# Patient Record
Sex: Female | Born: 1980 | Race: Black or African American | Hispanic: No | Smoking: Never smoker
Health system: Southern US, Community
[De-identification: ages and names within clinical notes are randomized; demographics above are authoritative.]

## PROBLEM LIST (undated history)

## (undated) ENCOUNTER — Emergency Department: Admission: EM | Disposition: A | Discharge status: Home / Self Care | Payer: Medicaid Other

## (undated) DIAGNOSIS — F32A Depression, unspecified: Secondary | ICD-10-CM

## (undated) DIAGNOSIS — I517 Cardiomegaly: Secondary | ICD-10-CM

## (undated) DIAGNOSIS — F329 Major depressive disorder, single episode, unspecified: Secondary | ICD-10-CM

## (undated) DIAGNOSIS — R7611 Nonspecific reaction to tuberculin skin test without active tuberculosis: Secondary | ICD-10-CM

## (undated) DIAGNOSIS — F909 Attention-deficit hyperactivity disorder, unspecified type: Secondary | ICD-10-CM

## (undated) DIAGNOSIS — A749 Chlamydial infection, unspecified: Secondary | ICD-10-CM

## (undated) DIAGNOSIS — F419 Anxiety disorder, unspecified: Secondary | ICD-10-CM

## (undated) DIAGNOSIS — K219 Gastro-esophageal reflux disease without esophagitis: Secondary | ICD-10-CM

## (undated) DIAGNOSIS — T1490XA Injury, unspecified, initial encounter: Secondary | ICD-10-CM

## (undated) DIAGNOSIS — B019 Varicella without complication: Secondary | ICD-10-CM

## (undated) DIAGNOSIS — O139 Gestational [pregnancy-induced] hypertension without significant proteinuria, unspecified trimester: Secondary | ICD-10-CM

## (undated) DIAGNOSIS — D509 Iron deficiency anemia, unspecified: Secondary | ICD-10-CM

## (undated) HISTORY — DX: Major depressive disorder, single episode, unspecified: F32.9

## (undated) HISTORY — DX: Gestational (pregnancy-induced) hypertension without significant proteinuria, unspecified trimester: O13.9

## (undated) HISTORY — DX: Iron deficiency anemia, unspecified: D50.9

## (undated) HISTORY — DX: Chlamydial infection, unspecified: A74.9

## (undated) HISTORY — DX: Injury, unspecified, initial encounter: T14.90XA

## (undated) HISTORY — DX: Morbid (severe) obesity due to excess calories: E66.01

## (undated) HISTORY — DX: Gastro-esophageal reflux disease without esophagitis: K21.9

## (undated) HISTORY — DX: Attention-deficit hyperactivity disorder, unspecified type: F90.9

## (undated) HISTORY — DX: Varicella without complication: B01.9

## (undated) HISTORY — DX: Anxiety disorder, unspecified: F41.9

## (undated) HISTORY — DX: Depression, unspecified: F32.A

## (undated) HISTORY — DX: Nonspecific reaction to tuberculin skin test without active tuberculosis: R76.11

---

## 2013-04-17 DIAGNOSIS — K219 Gastro-esophageal reflux disease without esophagitis: Secondary | ICD-10-CM | POA: Insufficient documentation

## 2013-06-25 DIAGNOSIS — R7611 Nonspecific reaction to tuberculin skin test without active tuberculosis: Secondary | ICD-10-CM | POA: Insufficient documentation

## 2013-08-02 DIAGNOSIS — D509 Iron deficiency anemia, unspecified: Secondary | ICD-10-CM | POA: Insufficient documentation

## 2016-02-21 DIAGNOSIS — O99213 Obesity complicating pregnancy, third trimester: Secondary | ICD-10-CM | POA: Insufficient documentation

## 2016-02-21 DIAGNOSIS — O30043 Twin pregnancy, dichorionic/diamniotic, third trimester: Secondary | ICD-10-CM | POA: Insufficient documentation

## 2016-02-21 DIAGNOSIS — O34219 Maternal care for unspecified type scar from previous cesarean delivery: Secondary | ICD-10-CM | POA: Insufficient documentation

## 2017-08-11 DIAGNOSIS — O163 Unspecified maternal hypertension, third trimester: Secondary | ICD-10-CM | POA: Insufficient documentation

## 2018-02-28 ENCOUNTER — Emergency Department
Admission: EM | Admit: 2018-02-28 | Discharge: 2018-02-28 | Disposition: A | Discharge status: Home / Self Care | Payer: Managed Care, Other (non HMO) | Attending: Emergency Medicine | Admitting: Emergency Medicine

## 2018-02-28 ENCOUNTER — Other Ambulatory Visit: Payer: Self-pay

## 2018-02-28 ENCOUNTER — Encounter: Payer: Self-pay | Admitting: Emergency Medicine

## 2018-02-28 DIAGNOSIS — R1030 Lower abdominal pain, unspecified: Secondary | ICD-10-CM | POA: Insufficient documentation

## 2018-02-28 DIAGNOSIS — R197 Diarrhea, unspecified: Secondary | ICD-10-CM | POA: Insufficient documentation

## 2018-02-28 LAB — COMPREHENSIVE METABOLIC PANEL
ALK PHOS: 59 U/L (ref 38–126)
ALT: 20 U/L (ref 14–54)
AST: 26 U/L (ref 15–41)
Albumin: 3.7 g/dL (ref 3.5–5.0)
Anion gap: 9 (ref 5–15)
BUN: 9 mg/dL (ref 6–20)
CALCIUM: 9.1 mg/dL (ref 8.9–10.3)
CO2: 25 mmol/L (ref 22–32)
CREATININE: 0.73 mg/dL (ref 0.44–1.00)
Chloride: 105 mmol/L (ref 101–111)
Glucose, Bld: 127 mg/dL — ABNORMAL HIGH (ref 65–99)
Potassium: 3.7 mmol/L (ref 3.5–5.1)
Sodium: 139 mmol/L (ref 135–145)
Total Bilirubin: 0.4 mg/dL (ref 0.3–1.2)
Total Protein: 7.5 g/dL (ref 6.5–8.1)

## 2018-02-28 LAB — CBC
HCT: 35.6 % (ref 35.0–47.0)
Hemoglobin: 11.4 g/dL — ABNORMAL LOW (ref 12.0–16.0)
MCH: 23.9 pg — AB (ref 26.0–34.0)
MCHC: 32 g/dL (ref 32.0–36.0)
MCV: 74.8 fL — AB (ref 80.0–100.0)
PLATELETS: 331 10*3/uL (ref 150–440)
RBC: 4.76 MIL/uL (ref 3.80–5.20)
RDW: 17.9 % — AB (ref 11.5–14.5)
WBC: 11.3 10*3/uL — ABNORMAL HIGH (ref 3.6–11.0)

## 2018-02-28 LAB — URINALYSIS, COMPLETE (UACMP) WITH MICROSCOPIC
Bacteria, UA: NONE SEEN
Bilirubin Urine: NEGATIVE
GLUCOSE, UA: NEGATIVE mg/dL
Hgb urine dipstick: NEGATIVE
Ketones, ur: NEGATIVE mg/dL
Leukocytes, UA: NEGATIVE
Nitrite: NEGATIVE
PH: 7 (ref 5.0–8.0)
Protein, ur: NEGATIVE mg/dL
SPECIFIC GRAVITY, URINE: 1.015 (ref 1.005–1.030)

## 2018-02-28 LAB — POCT PREGNANCY, URINE: Preg Test, Ur: NEGATIVE

## 2018-02-28 LAB — LIPASE, BLOOD: Lipase: 28 U/L (ref 11–51)

## 2018-02-28 NOTE — ED Provider Notes (Signed)
[facility] Emergency Department Provider Note   ____________________________________________   First MD Initiated Contact with Patient 02/28/18 [phone]     (approximate)  I have reviewed the triage vital signs and the nursing notes.   HISTORY  Chief Complaint Diarrhea    HPI Yesenia Thomas is a 37 y.o. female who comes into the hospital today with some loose stools for the past couple of weeks.  She reports that her friend was diagnosed with C. difficile so she wanted to check to ensure that she did not have C. difficile.  The patient has not had any fevers and she is had some mild abdominal pain.  She reports it is in her lower abdomen and she rates her pain a 6 out of 10 in intensity.  The patient has not had any vomiting.  She states that she is also lactose intolerant and has been drinking different types of milk.  She reports that when she drinks different types of milk it does also cause some diarrhea for her.  The patient was concerned so she decided to come and get checked out.   History reviewed. No pertinent past medical history.  There are no active problems to display for this patient.   Past Surgical History:  Procedure Laterality Date  . CESAREAN SECTION      Prior to Admission medications   Not on File    Allergies Lactose intolerance (gi) and Percocet [oxycodone-acetaminophen]  No family history on file.  Social History Social History   Tobacco Use  . Smoking status: Never Smoker  . Smokeless tobacco: Never Used  Substance Use Topics  . Alcohol use: Not Currently  . Drug use: Never    Review of Systems  Constitutional: No fever/chills Eyes: No visual changes. ENT: No sore throat. Cardiovascular: Denies chest pain. Respiratory: Denies shortness of breath. Gastrointestinal:  abdominal pain and diarrhea with no nausea, no vomiting.  No constipation. Genitourinary: Negative for dysuria. Musculoskeletal: Negative  for back pain. Skin: Negative for rash. Neurological: Negative for headaches, focal weakness or numbness.   ____________________________________________   PHYSICAL EXAM:  VITAL SIGNS: ED Triage Vitals  Enc Vitals Group     BP 02/28/18 0527 123/72     Pulse Rate 02/28/18 0527 83     Resp 02/28/18 0527 17     Temp --      Temp src --      SpO2 02/28/18 0527 100 %     Weight 02/28/18 0237 (!) 325 lb (147.4 kg)     Height 02/28/18 0237 5\' 6"  (1.676 m)     Head Circumference --      Peak Flow --      Pain Score 02/28/18 0237 6     Pain Loc --      Pain Edu? --      Excl. in GC? --     Constitutional: Alert and oriented. Well appearing and in mild distress. Eyes: Conjunctivae are normal. PERRL. EOMI. Head: Atraumatic. Nose: No congestion/rhinnorhea. Mouth/Throat: Mucous membranes are moist.  Oropharynx non-erythematous. Cardiovascular: Normal rate, regular rhythm. Grossly normal heart sounds.  Good peripheral circulation. Respiratory: Normal respiratory effort.  No retractions. Lungs CTAB. Gastrointestinal: Soft and nontender. No distention.  Positive bowel sounds Musculoskeletal: No lower extremity tenderness nor edema.  Neurologic:  Normal speech and language.  Skin:  Skin is warm, dry and intact.  Psychiatric: Mood and affect are normal.   ____________________________________________   LABS (all labs ordered are listed,  but only abnormal results are displayed)  Labs Reviewed  COMPREHENSIVE METABOLIC PANEL - Abnormal; Notable for the following components:      Result Value   Glucose, Bld 127 (*)    All other components within normal limits  CBC - Abnormal; Notable for the following components:   WBC 11.3 (*)    Hemoglobin 11.4 (*)    MCV 74.8 (*)    MCH 23.9 (*)    RDW 17.9 (*)    All other components within normal limits  URINALYSIS, COMPLETE (UACMP) WITH MICROSCOPIC - Abnormal; Notable for the following components:   Color, Urine YELLOW (*)    APPearance  CLEAR (*)    All other components within normal limits  GASTROINTESTINAL PANEL BY PCR, STOOL (REPLACES STOOL CULTURE)  C DIFFICILE QUICK SCREEN W PCR REFLEX  LIPASE, BLOOD  POC URINE PREG, ED  POCT PREGNANCY, URINE   ____________________________________________  EKG  none ____________________________________________  RADIOLOGY  ED MD interpretation:  none  Official radiology report(s): No results found.  ____________________________________________   PROCEDURES  Procedure(s) performed: None  Procedures  Critical Care performed: No  ____________________________________________   INITIAL IMPRESSION / ASSESSMENT AND PLAN / ED COURSE  As part of my medical decision making, I reviewed the following data within the electronic MEDICAL RECORD NUMBER Notes from prior ED visits and [place] Controlled Substance Database   This is a 37 year old female who comes into the hospital today with some diarrhea and abdominal pain.  She is concerned about C. Difficile.  We did check some blood work on the patient to include a CBC, CMP, lipase.  The patient blood work is unremarkable.  The patient also had a urinalysis which was negative.  I did initially order stool PCR as well as a C. difficile but the patient could not have a bowel movement in the 5 hours that she had been in the emergency department.  I did asked the patient if she would rather follow-up with her primary care physician.  We will give the patient some urine specimen cups and have her follow-up with the acute care clinic should she be able to produce a sample.  The patient did not have any abdominal pain on exam and she was comfortable while in the emergency department.  Her vital signs are also unremarkable.  The patient will be discharged home to follow-up with the acute care clinic.     ____________________________________________   FINAL CLINICAL IMPRESSION(S) / ED DIAGNOSES  Final diagnoses:  Diarrhea, unspecified type      ED Discharge Orders    None       Note:  This document was prepared using Dragon voice recognition software and may include unintentional dictation errors.    Rebecka ApleyWebster, Dory Verdun P, MD 02/28/18 [phone]

## 2018-02-28 NOTE — Discharge Instructions (Addendum)
Please follow-up with your primary care physician for further evaluation of your diarrhea.  Please return with any fevers or any other concerns

## 2018-02-28 NOTE — ED Notes (Signed)
Pt resting in bed with NAD noted at this time. Pt noted to be resting with eyes closed, noted to be snoring upon this RN entering room. Pt noted to be holding her son who is also a patient. Will continue to monitor for further patient needs and will introduce self upon patient awakening.

## 2018-02-28 NOTE — ED Notes (Signed)
NAD noted at time of D/C. Pt ambulatory to the lobby with her son who was also a patient. Pt given sterile cups to collect stool sample at home and instructed to [facility] if she was able to collect sample. Pt states understanding.

## 2018-02-28 NOTE — ED Triage Notes (Signed)
Patient ambulatory to triage with steady gait, without difficulty or distress noted; pt reports diarrhea and abd cramping several wks; here with son also being seen for same; st she is concerned because her friend has c-diff

## 2018-07-17 ENCOUNTER — Ambulatory Visit: Payer: Self-pay | Admitting: Family Medicine

## 2018-07-18 ENCOUNTER — Ambulatory Visit: Payer: Self-pay | Admitting: Family Medicine

## 2018-11-27 ENCOUNTER — Encounter: Payer: Self-pay | Admitting: Emergency Medicine

## 2018-11-27 ENCOUNTER — Encounter: Payer: Self-pay | Admitting: Family Medicine

## 2018-11-27 ENCOUNTER — Ambulatory Visit: Payer: Managed Care, Other (non HMO) | Admitting: Family Medicine

## 2018-11-27 ENCOUNTER — Other Ambulatory Visit: Payer: Self-pay

## 2018-11-27 VITALS — BP 128/72 | HR 88 | Temp 99.4°F | Resp 16 | Ht 66.0 in | Wt >= 6400 oz

## 2018-11-27 DIAGNOSIS — R0602 Shortness of breath: Secondary | ICD-10-CM

## 2018-11-27 DIAGNOSIS — R05 Cough: Secondary | ICD-10-CM

## 2018-11-27 DIAGNOSIS — R0789 Other chest pain: Secondary | ICD-10-CM | POA: Diagnosis not present

## 2018-11-27 DIAGNOSIS — R058 Other specified cough: Secondary | ICD-10-CM

## 2018-11-27 LAB — POCT INFLUENZA A/B
INFLUENZA A, POC: NEGATIVE
INFLUENZA B, POC: NEGATIVE

## 2018-11-27 LAB — POCT RAPID STREP A (OFFICE): Rapid Strep A Screen: NEGATIVE

## 2018-11-27 MED ORDER — BENZONATATE 100 MG PO CAPS: 100.0000 mg | ORAL_CAPSULE | Freq: Three times a day (TID) | ORAL | 0 refills | Status: DC | PRN

## 2018-11-27 MED ORDER — ALBUTEROL SULFATE HFA 108 (90 BASE) MCG/ACT IN AERS: 2.0000 | INHALATION_SPRAY | Freq: Four times a day (QID) | RESPIRATORY_TRACT | 2 refills | Status: DC | PRN

## 2018-11-27 NOTE — Patient Instructions (Addendum)
Tomorrow: You need to take your order for COVID19 testing to the "Drive through" testing site at [address] [facility] [place].  Coronavirus (COVID-19) Are you at risk?  Are you at risk for the Coronavirus (COVID-19)?  To be considered HIGH RISK for Coronavirus (COVID-19), you have to meet the following criteria:  . Traveled to [place], [place], [place], [place] or [place]; or in the [place] to [place], [place], [place], or [place]; and have fever, cough, and shortness of breath within the last 2 weeks of travel OR . Been in close contact with a person diagnosed with COVID-19 within the last 2 weeks and have fever, cough, and shortness of breath . IF YOU DO NOT MEET THESE CRITERIA, YOU ARE CONSIDERED LOW RISK FOR COVID-19.  What to do if you are HIGH RISK for COVID-19?  Marland Kitchen If you are having a medical emergency, call 911. . Seek medical care right away. Before you go to a doctor's office, urgent care or emergency department, call ahead and tell them about your recent travel, contact with someone diagnosed with COVID-19, and your symptoms. You should receive instructions from your physician's office regarding next steps of care.  . When you arrive at healthcare provider, tell the healthcare staff immediately you have returned from visiting [place], [place], [place], [place] or [place]; or traveled in the [place] to [place], [place], [place], or [place]; in the last two weeks or you have been in close contact with a person diagnosed with COVID-19 in the last 2 weeks.   . Tell the health care staff about your symptoms: fever, cough and shortness of breath. . After you have been seen by a medical provider, you will be either: o Tested for (COVID-19) and discharged home on quarantine except to seek medical care if symptoms worsen, and asked to  - Stay home and avoid contact with others until you get your results (4-5 days)  - Avoid travel on public transportation if  possible (such as bus, train, or airplane) or o Sent to the Emergency Department by EMS for evaluation, COVID-19 testing, and possible admission depending on your condition and test results.  What to do if you are LOW RISK for COVID-19?  Reduce your risk of any infection by using the same precautions used for avoiding the common cold or flu:  Marland Kitchen Wash your hands often with soap and warm water for at least 20 seconds.  If soap and water are not readily available, use an alcohol-based hand sanitizer with at least 60% alcohol.  . If coughing or sneezing, cover your mouth and nose by coughing or sneezing into the elbow areas of your shirt or coat, into a tissue or into your sleeve (not your hands). . Avoid shaking hands with others and consider head nods or verbal greetings only. . Avoid touching your eyes, nose, or mouth with unwashed hands.  . Avoid close contact with people who are sick. . Avoid places or events with large numbers of people in one location, like concerts or sporting events. . Carefully consider travel plans you have or are making. . If you are planning any travel outside or inside the [place], visit the CDC's Travelers' Health webpage for the latest health notices. . If you have some symptoms but not all symptoms, continue to monitor at home and seek medical attention if your symptoms worsen. . If you are having a medical emergency, call 911.   ADDITIONAL HEALTHCARE OPTIONS FOR  PATIENTS  [facility] / e-Visit: [email or web address]         [facility]: [phone]  [facility]: [phone]                   [facility]: [phone]

## 2018-11-27 NOTE — Progress Notes (Signed)
Name: Yesenia Thomas   MRN: [id number]    DOB: 02-27-1981   Date:11/27/2018       Progress Note  Subjective  Chief Complaint  Chief Complaint  Patient presents with  . Establish Care  . URI    cough, fever, sore throat, fever  . Shortness of Breath    HPI  Pt presents to establish care and with concern for respiratory illness x3 days.  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  They have had multiple COVID-19 cases, and she is unsure if she was in contact with any infected persons.  She described subjective fevers at home (temp in office today is 99.1F), shortness of breath, chest tightness, non-productive cough. No nasal congestion, asthma, smoking, or COPD history. No recent travel.  She has her 55 month old son with her today.  There are no active problems to display for this patient.   Past Surgical History:  Procedure Laterality Date  . CESAREAN SECTION      Family History  Problem Relation Age of Onset  . Hypertension Mother   . Thrombosis Maternal Grandmother   . Clotting disorder Maternal Grandfather   . Deep vein thrombosis Maternal Grandfather     Social History   Socioeconomic History  . Marital status: Unknown    Spouse name: Not on file  . Number of children: Not on file  . Years of education: Not on file  . Highest education level: Not on file  Occupational History  . Not on file  Social Needs  . Financial resource strain: Not on file  . Food insecurity:    Worry: Not on file    Inability: Not on file  . Transportation needs:    Medical: Not on file    Non-medical: Not on file  Tobacco Use  . Smoking status: Never Smoker  . Smokeless tobacco: Never Used  Substance and Sexual Activity  . Alcohol use: Not Currently  . Drug use: Never  . Sexual activity: Yes  Lifestyle  . Physical activity:    Days per week: Not on file    Minutes per session: Not on file  . Stress: Not on file  Relationships   . Social connections:    Talks on phone: Not on file    Gets together: Not on file    Attends religious service: Not on file    Active member of club or organization: Not on file    Attends meetings of clubs or organizations: Not on file    Relationship status: Not on file  . Intimate partner violence:    Fear of current or ex partner: Not on file    Emotionally abused: Not on file    Physically abused: Not on file    Forced sexual activity: Not on file  Other Topics Concern  . Not on file  Social History Narrative  . Not on file     Current Outpatient Medications:  .  Acetaminophen 500 MG coapsule, Take by mouth., Disp: , Rfl:  .  albuterol (PROVENTIL HFA;VENTOLIN HFA) 108 (90 Base) MCG/ACT inhaler, Inhale 2 puffs into the lungs every 6 (six) hours as needed for wheezing or shortness of breath., Disp: 1 Inhaler, Rfl: 2 .  benzonatate (TESSALON PERLES) 100 MG capsule, Take 1 capsule (100 mg total) by mouth 3 (three) times daily as needed for cough., Disp: 30 capsule, Rfl: 0  Allergies  Allergen Reactions  .  Lactose Intolerance (Gi)   . Percocet [Oxycodone-Acetaminophen]     I personally reviewed active problem list, medication list, allergies, lab results with the patient/caregiver today.  ROS Ten systems reviewed and is negative except as mentioned in HPI  Objective  Vitals:   11/27/18 1417  BP: 128/72  Pulse: 88  Resp: 16  Temp: 99.4 F (37.4 C)  TempSrc: Oral  SpO2: 95%  Weight: (!) 405 lb (183.7 kg)  Height: 5\' 6"  (1.676 m)    Body mass index is 65.37 kg/m.  Physical Exam Constitutional: Patient appears well-developed and well-nourished. Morbidly Obese. No distress.  HEENT: head atraumatic, normocephalic, pupils equal and reactive to light, Bilateral TM's without erythema or effusion,  bilateral maxillary and frontal sinuses are non-tender, neck supple without lymphadenopathy, throat within normal limits - no erythema or exudate, no tonsillar swelling  Cardiovascular: Normal rate, regular rhythm and normal heart sounds.  No murmur heard. No BLE edema. Pulmonary/Chest: Effort normal and breath sounds clear bilaterally. No respiratory distress. Abdominal: Soft, bowel sounds normal, there is no tenderness, no HSM Psychiatric: Patient has a normal mood and affect. behavior is normal. Judgment and thought content normal.  No results found for this or any previous visit (from the past 72 hour(s)).   PHQ2/9: Deferred due to concern for COVID-19  Assessment & Plan  1. Shortness of breath - POCT Influenza A/B - Negative - POCT rapid strep A - Negative - Respiratory virus panel - pending - Novel Coronavirus, NAA ([facility]) - albuterol (PROVENTIL HFA;VENTOLIN HFA) 108 (90 Base) MCG/ACT inhaler; Inhale 2 puffs into the lungs every 6 (six) hours as needed for wheezing or shortness of breath.  Dispense: 1 Inhaler; Refill: 2  2. Chest tightness - POCT Influenza A/B - Negative - POCT rapid strep A - Negative - Respiratory virus panel - pending - Novel Coronavirus, NAA ([facility])  3. Non-productive cough - POCT Influenza A/B - Negative - POCT rapid strep A - Negative - Respiratory virus panel - pending - Novel Coronavirus, NAA ([facility])  Patient is in no acute distress, she does answer questions consistent with a mild lower respiratory illness and has had potential contact with a case at her job at [employer] as a CNA in the float pool at their hospital.  Therefore, I have followed the provided protocols, a log of who came into contact with the patient during her visit, a PUI and Case Report Form, and the Guidance for PUI self-quarantine forms are completed and will be faxed to the Health Department.    I did place a call to Angie with IP who took down patient's information. Placed call to [facility] Department - Glenna Fellows, then faxed the appropriate paperwork to the HD.

## 2018-11-28 ENCOUNTER — Other Ambulatory Visit: Payer: Self-pay

## 2018-11-28 ENCOUNTER — Telehealth: Payer: Self-pay | Admitting: Family Medicine

## 2018-11-28 DIAGNOSIS — R6889 Other general symptoms and signs: Secondary | ICD-10-CM

## 2018-11-28 LAB — RESPIRATORY VIRUS PANEL

## 2018-11-28 NOTE — Telephone Encounter (Signed)
Miel spoke with patient an informed her where to go for the drive through COVID Testing Center at [address] [place], [place], [place] [address].  Patient was given this information on her visit on 11/27/2018.  Miel also informed patient to arrive by 5pm today because the hours are from 8am - 5pm.  Patient verbalized understanding.

## 2018-11-29 ENCOUNTER — Telehealth: Payer: Self-pay | Admitting: Family Medicine

## 2018-11-29 NOTE — Telephone Encounter (Signed)
Please speak with Miel regarding this - if there is any protocol in place, then proceed with that.  If not, please call health department to notify that patient has not gone for testing and we have not been able to contact her.  Her symptoms were mild, so if she refuses testing or is unable to go, I recommend self-quarantine per guidelines.

## 2018-11-29 NOTE — Telephone Encounter (Signed)
Please call patient - as of yesterday afternoon she had not gone for testing for COVID-19 at [facility], and she absolutely MUST go to do this.  If she has not gone by today, please call health department and obtain further instruction on what to do if she refuses to go to be tested.  Please again advise that she continue to remain completely quarantined at home, following the guidelines that I gave her at her visit until I receive her results. Thanks!

## 2018-11-29 NOTE — Telephone Encounter (Signed)
Called patient and voice mail cannot take any messages

## 2018-11-29 NOTE — Telephone Encounter (Signed)
Patient called back and stated she went to Testing Center yesterday

## 2018-12-01 ENCOUNTER — Telehealth: Payer: Self-pay | Admitting: Family Medicine

## 2018-12-01 DIAGNOSIS — R110 Nausea: Secondary | ICD-10-CM

## 2018-12-01 DIAGNOSIS — R0602 Shortness of breath: Secondary | ICD-10-CM

## 2018-12-01 NOTE — Telephone Encounter (Signed)
rec'd voice message on the COVID 19 message line from [facility], Production designer, theatre/television/film at [employer].  Requesting information on when pt. Can return to work, after being seen in the office on 11/27/18.  Will forward note to provider for further recommendations.

## 2018-12-01 NOTE — Telephone Encounter (Signed)
Please advise 

## 2018-12-03 NOTE — Telephone Encounter (Signed)
I do not have result of COVID19 test.  I need this result in order to guide my recommendations.  Please obtain.

## 2018-12-04 ENCOUNTER — Telehealth: Payer: Self-pay | Admitting: Emergency Medicine

## 2018-12-04 MED ORDER — ONDANSETRON HCL 4 MG PO TABS: 4.0000 mg | ORAL_TABLET | Freq: Three times a day (TID) | ORAL | 0 refills | Status: DC | PRN

## 2018-12-04 MED ORDER — ALBUTEROL SULFATE (2.5 MG/3ML) 0.083% IN NEBU: 2.5000 mg | INHALATION_SOLUTION | Freq: Four times a day (QID) | RESPIRATORY_TRACT | 0 refills | Status: DC | PRN

## 2018-12-04 NOTE — Telephone Encounter (Signed)
Irving Burton spoke to patient job

## 2018-12-04 NOTE — Telephone Encounter (Signed)
Spoke to [facility] test results maybe ready on 3/25

## 2018-12-04 NOTE — Telephone Encounter (Signed)
Spoke to patient and she  Stated she had just spoke to her job. Patient gave a verbal ok for you to speak to job

## 2018-12-04 NOTE — Telephone Encounter (Signed)
Patient did contact her employer to discuss ongoing care and awaiting testing.

## 2018-12-04 NOTE — Telephone Encounter (Addendum)
Yesenia Thomas - please call Ms. McNeil for patient's job and advise patient must remain out until COVID19 testing is returned this Thursday. She needs a new work note - please provide this today to [employer] [phone].  Called [facility] - will not have COVID19 testing until thursday 12/07/2018.    Called patient to check in 12/04/2018 at 10:00am: Patient reports having chest tightness, shortness of breath, and some vomiting (3 episodes in the last 24 hours).  She never picked up the albuterol inhaler that I sent in, but does note she has access to nebulizer.  She notes nausea and decreased appetite.  No fever on her home thermometer.

## 2018-12-04 NOTE — Addendum Note (Signed)
Addended by: Doren Custard on: 12/04/2018 10:17 AM   Modules accepted: Orders

## 2018-12-07 ENCOUNTER — Telehealth: Payer: Self-pay | Admitting: Family Medicine

## 2018-12-07 LAB — NOVEL CORONAVIRUS, NAA: SARS-CoV-2, NAA: NOT DETECTED

## 2018-12-07 NOTE — Telephone Encounter (Signed)
Please call patient and let her know that per verbal report her test is negative.  She may return to work.  If she is not seeing improvement in her symptoms, she needs to be seen in person so that we may listen to her lung sounds and check oxygen level.  Please provide note to her releasing her to return to work.

## 2018-12-07 NOTE — Telephone Encounter (Signed)
Patient notified. Appointment tomorrow to be cleared to return to work

## 2018-12-07 NOTE — Telephone Encounter (Signed)
Please call [facility] - we were told we would have COVID19 test results by today and are still waiting.

## 2018-12-07 NOTE — Telephone Encounter (Signed)
Test negative per  [facility]. It will be faxed to you

## 2018-12-07 NOTE — Telephone Encounter (Signed)
Pt called to follow up on her Covid 19 test results. Nurse unavailable at the time. Advised to send message over. Please reach out to pt.

## 2018-12-08 ENCOUNTER — Encounter: Payer: Self-pay | Admitting: Emergency Medicine

## 2018-12-08 ENCOUNTER — Ambulatory Visit
Admission: RE | Admit: 2018-12-08 | Discharge: 2018-12-08 | Disposition: A | Discharge status: Home / Self Care | Payer: Managed Care, Other (non HMO) | Attending: Family Medicine | Admitting: Family Medicine

## 2018-12-08 ENCOUNTER — Encounter: Payer: Self-pay | Admitting: Family Medicine

## 2018-12-08 ENCOUNTER — Ambulatory Visit (INDEPENDENT_AMBULATORY_CARE_PROVIDER_SITE_OTHER): Payer: Managed Care, Other (non HMO) | Admitting: Family Medicine

## 2018-12-08 ENCOUNTER — Ambulatory Visit
Admission: RE | Admit: 2018-12-08 | Discharge: 2018-12-08 | Disposition: A | Discharge status: Home / Self Care | Payer: Managed Care, Other (non HMO) | Source: Ambulatory Visit | Attending: Family Medicine | Admitting: Family Medicine

## 2018-12-08 ENCOUNTER — Other Ambulatory Visit: Payer: Self-pay

## 2018-12-08 VITALS — BP 130/84 | HR 94 | Temp 97.9°F | Resp 18 | Ht 66.0 in | Wt 396.4 lb

## 2018-12-08 DIAGNOSIS — R05 Cough: Secondary | ICD-10-CM

## 2018-12-08 DIAGNOSIS — R059 Cough, unspecified: Secondary | ICD-10-CM

## 2018-12-08 DIAGNOSIS — R0602 Shortness of breath: Secondary | ICD-10-CM | POA: Insufficient documentation

## 2018-12-08 DIAGNOSIS — IMO0001 Reserved for inherently not codable concepts without codable children: Secondary | ICD-10-CM

## 2018-12-08 DIAGNOSIS — Z20822 Contact with and (suspected) exposure to covid-19: Secondary | ICD-10-CM

## 2018-12-08 MED ORDER — PROMETHAZINE-DM 6.25-15 MG/5ML PO SYRP: 5.0000 mL | ORAL_SOLUTION | Freq: Four times a day (QID) | ORAL | 0 refills | Status: DC | PRN

## 2018-12-08 NOTE — Patient Instructions (Signed)
Download Webex App and get signed up so that we can address your ongoing medical problems.

## 2018-12-08 NOTE — Progress Notes (Signed)
Acute Office Visit  Subjective:    Patient ID: Yesenia Thomas, female    DOB: 02/07/1981, 38 y.o.   MRN: [id number]  Chief Complaint  Patient presents with  . Follow-up    HPI Patient is in today for respiratory illness.  She was tested for COVID-19 and had negative result.  She reports ongoing chest tightness, shortness of breath; had 1 episode of vomiting in the last 24 hours due to coughing too hard.  Denies body aches, diarrhea, fevers, chills.  She works at [employer] as a Lawyer in [employer] and has not gone back to work since having negative test.  She is using albuterol that was sent in and this does seem to help somewhat.  Past Medical History:  Diagnosis Date  . ADHD   . Anxiety   . Chlamydia   . Depression   . GERD (gastroesophageal reflux disease)   . Iron deficiency anemia   . Morbid obesity due to excess calories (HCC)   . PPD positive   . Pregnancy induced hypertension   . Trauma   . Varicella     Past Surgical History:  Procedure Laterality Date  . CESAREAN SECTION      Family History  Problem Relation Age of Onset  . Hypertension Mother   . Thrombosis Maternal Grandmother   . Clotting disorder Maternal Grandfather   . Deep vein thrombosis Maternal Grandfather     Social History   Socioeconomic History  . Marital status: Unknown    Spouse name: Not on file  . Number of children: Not on file  . Years of education: Not on file  . Highest education level: Not on file  Occupational History  . Not on file  Social Needs  . Financial resource strain: Not on file  . Food insecurity:    Worry: Not on file    Inability: Not on file  . Transportation needs:    Medical: Not on file    Non-medical: Not on file  Tobacco Use  . Smoking status: Never Smoker  . Smokeless tobacco: Never Used  Substance and Sexual Activity  . Alcohol use: Not Currently  . Drug use: Never  . Sexual activity: Yes  Lifestyle  . Physical activity:    Days per week:  Not on file    Minutes per session: Not on file  . Stress: Not on file  Relationships  . Social connections:    Talks on phone: Not on file    Gets together: Not on file    Attends religious service: Not on file    Active member of club or organization: Not on file    Attends meetings of clubs or organizations: Not on file    Relationship status: Not on file  . Intimate partner violence:    Fear of current or ex partner: Not on file    Emotionally abused: Not on file    Physically abused: Not on file    Forced sexual activity: Not on file  Other Topics Concern  . Not on file  Social History Narrative  . Not on file    Outpatient Medications Prior to Visit  Medication Sig Dispense Refill  . Acetaminophen 500 MG coapsule Take by mouth.    Marland Kitchen albuterol (PROVENTIL HFA;VENTOLIN HFA) 108 (90 Base) MCG/ACT inhaler Inhale 2 puffs into the lungs every 6 (six) hours as needed for wheezing or shortness of breath. 1 Inhaler 2  . albuterol (PROVENTIL) (2.5 MG/3ML) 0.083%  nebulizer solution Take 3 mLs (2.5 mg total) by nebulization every 6 (six) hours as needed for wheezing or shortness of breath. 150 mL 0  . benzonatate (TESSALON PERLES) 100 MG capsule Take 1 capsule (100 mg total) by mouth 3 (three) times daily as needed for cough. 30 capsule 0  . ondansetron (ZOFRAN) 4 MG tablet Take 1 tablet (4 mg total) by mouth every 8 (eight) hours as needed for nausea or vomiting. 20 tablet 0   No facility-administered medications prior to visit.     Allergies  Allergen Reactions  . Lactose Intolerance (Gi)   . Percocet [Oxycodone-Acetaminophen]     ROS Ten systems reviewed and is negative except as mentioned in HPI    Objective:    Physical Exam Constitutional: Patient appears well-developed and well-nourished. Obese. No distress.  HEENT: head atraumatic, normocephalic, pupils equal and reactive to light, Bilateral TM's without erythema or effusion,  bilateral maxillary and frontal sinuses are  non-tender, neck supple without lymphadenopathy, throat within normal limits - no erythema or exudate, no tonsillar swelling Cardiovascular: Normal rate, regular rhythm and normal heart sounds.  No murmur heard. No BLE edema. Pulmonary/Chest: Effort normal and breath sounds clear bilaterally. No respiratory distress. Abdominal: Soft, bowel sounds normal, there is no tenderness, no HSM Psychiatric: Patient has a normal mood and affect. behavior is normal. Judgment and thought content normal.   BP 130/84 (BP Location: Right Arm, Patient Position: Sitting, Cuff Size: Large)   Pulse 94   Temp 97.9 F (36.6 C)   Resp 18   Ht 5\' 6"  (1.676 m)   Wt (!) 396 lb 6.4 oz (179.8 kg)   SpO2 97%   BMI 63.98 kg/m  Wt Readings from Last 3 Encounters:  12/08/18 (!) 396 lb 6.4 oz (179.8 kg)  11/27/18 (!) 405 lb (183.7 kg)  02/28/18 (!) 325 lb (147.4 kg)    Health Maintenance Due  Topic Date Due  . HIV Screening  11/20/1995  . PAP SMEAR-Modifier  11/19/2001  . INFLUENZA VACCINE  04/13/2018    There are no preventive care reminders to display for this patient.   No results found for: TSH Lab Results  Component Value Date   WBC 11.3 (H) 02/28/2018   HGB 11.4 (L) 02/28/2018   HCT 35.6 02/28/2018   MCV 74.8 (L) 02/28/2018   PLT 331 02/28/2018   Lab Results  Component Value Date   NA 139 02/28/2018   K 3.7 02/28/2018   CO2 25 02/28/2018   GLUCOSE 127 (H) 02/28/2018   BUN 9 02/28/2018   CREATININE 0.73 02/28/2018   BILITOT 0.4 02/28/2018   ALKPHOS 59 02/28/2018   AST 26 02/28/2018   ALT 20 02/28/2018   PROT 7.5 02/28/2018   ALBUMIN 3.7 02/28/2018   CALCIUM 9.1 02/28/2018   ANIONGAP 9 02/28/2018   No results found for: CHOL No results found for: HDL No results found for: LDLCALC No results found for: TRIG No results found for: CHOLHDL No results found for: [id number]     Assessment & Plan:   Problem List Items Addressed This Visit    None    Visit Diagnoses    Cough    -   Primary   Relevant Medications   promethazine-dextromethorphan (PROMETHAZINE-DM) 6.25-15 MG/5ML syrup   Other Relevant Orders   DG Chest 2 View   Shortness of breath       Relevant Medications   promethazine-dextromethorphan (PROMETHAZINE-DM) 6.25-15 MG/5ML syrup   Other Relevant Orders   DG  Chest 2 View   Covid-19 Virus not Detected         Meds ordered this encounter  Medications  . promethazine-dextromethorphan (PROMETHAZINE-DM) 6.25-15 MG/5ML syrup    Sig: Take 5 mLs by mouth 4 (four) times daily as needed.    Dispense:  118 mL    Refill:  0    Order Specific Question:   Supervising Provider    Answer:   Alba Cory [[id number]]   Doren Custard, FNP

## 2019-05-18 ENCOUNTER — Ambulatory Visit: Payer: Self-pay | Admitting: *Deleted

## 2019-05-18 NOTE — Telephone Encounter (Signed)
Patient is calling with multiple symptoms- chest pain, hip pain, back pain. Patient states the worse symptoms now is chest pain.  Reason for Disposition . Dizziness or lightheadedness  Answer Assessment - Initial Assessment Questions 1. LOCATION: "Where does it hurt?"       Middle of chest and to the left at times 2. RADIATION: "Does the pain go anywhere else?" (e.g., into neck, jaw, arms, back)     Patient is having arm pain-when she is having chest pain 3. ONSET: "When did the chest pain begin?" (Minutes, hours or days)      Patient states her symptoms have been present for a while- getting worse over time- last time patient had chest pain -now 4. PATTERN "Does the pain come and go, or has it been constant since it started?"  "Does it get worse with exertion?"      Comes and goes- sharp pains that make it hard to breath- it is worse with activity 5. DURATION: "How long does it last" (e.g., seconds, minutes, hours)     seconds 6. SEVERITY: "How bad is the pain?"  (e.g., Scale 1-10; mild, moderate, or severe)    - MILD (1-3): doesn't interfere with normal activities     - MODERATE (4-7): interferes with normal activities or awakens from sleep    - SEVERE (8-10): excruciating pain, unable to do any normal activities       8 7. CARDIAC RISK FACTORS: "Do you have any history of heart problems or risk factors for heart disease?" (e.g., angina, prior heart attack; diabetes, high blood pressure, high cholesterol, smoker, or strong family history of heart disease)     Patient had history of heart problem with pregnancy 8. PULMONARY RISK FACTORS: "Do you have any history of lung disease?"  (e.g., blood clots in lung, asthma, emphysema, birth control pills)     no 9. CAUSE: "What do you think is causing the chest pain?"     unknown 10. OTHER SYMPTOMS: "Do you have any other symptoms?" (e.g., dizziness, nausea, vomiting, sweating, fever, difficulty breathing, cough)       Some dizziness at times,  hip pain 11. PREGNANCY: "Is there any chance you are pregnant?" "When was your last menstrual period?"       No- LMP- 8/23  Protocols used: CHEST PAIN-A-AH

## 2019-05-18 NOTE — Telephone Encounter (Signed)
Agree with disposition - needs to try to get to ER for chest pain.

## 2019-05-18 NOTE — Telephone Encounter (Signed)
Spoke to patient to see if she is going to ER. Patient stated she do not have a sitter for her son  But will see if she can work out something soon.

## 2019-05-24 ENCOUNTER — Ambulatory Visit: Payer: Managed Care, Other (non HMO) | Admitting: Family Medicine

## 2019-05-25 ENCOUNTER — Ambulatory Visit
Admission: RE | Admit: 2019-05-25 | Discharge: 2019-05-25 | Disposition: A | Discharge status: Home / Self Care | Payer: 59 | Source: Ambulatory Visit | Attending: Family Medicine | Admitting: Family Medicine

## 2019-05-25 ENCOUNTER — Encounter: Payer: Self-pay | Admitting: Family Medicine

## 2019-05-25 ENCOUNTER — Other Ambulatory Visit: Payer: Self-pay

## 2019-05-25 ENCOUNTER — Ambulatory Visit (INDEPENDENT_AMBULATORY_CARE_PROVIDER_SITE_OTHER): Payer: Managed Care, Other (non HMO) | Admitting: Family Medicine

## 2019-05-25 VITALS — BP 134/92 | HR 86 | Temp 98.0°F | Resp 14 | Ht 66.0 in | Wt 386.1 lb

## 2019-05-25 DIAGNOSIS — R109 Unspecified abdominal pain: Secondary | ICD-10-CM | POA: Diagnosis not present

## 2019-05-25 DIAGNOSIS — Z6841 Body Mass Index (BMI) 40.0 and over, adult: Secondary | ICD-10-CM

## 2019-05-25 DIAGNOSIS — K2100 Gastro-esophageal reflux disease with esophagitis, without bleeding: Secondary | ICD-10-CM

## 2019-05-25 DIAGNOSIS — R350 Frequency of micturition: Secondary | ICD-10-CM | POA: Diagnosis not present

## 2019-05-25 DIAGNOSIS — R0602 Shortness of breath: Secondary | ICD-10-CM

## 2019-05-25 DIAGNOSIS — M549 Dorsalgia, unspecified: Secondary | ICD-10-CM

## 2019-05-25 DIAGNOSIS — R079 Chest pain, unspecified: Secondary | ICD-10-CM

## 2019-05-25 DIAGNOSIS — R358 Other polyuria: Secondary | ICD-10-CM

## 2019-05-25 DIAGNOSIS — D649 Anemia, unspecified: Secondary | ICD-10-CM

## 2019-05-25 LAB — POCT URINALYSIS DIPSTICK
Bilirubin, UA: NEGATIVE
Blood, UA: NEGATIVE
Glucose, UA: NEGATIVE
Ketones, UA: NEGATIVE
Leukocytes, UA: NEGATIVE
Nitrite, UA: NEGATIVE
Protein, UA: NEGATIVE
Spec Grav, UA: 1.015 (ref 1.010–1.025)
Urobilinogen, UA: 0.2 E.U./dL
pH, UA: 6.5 (ref 5.0–8.0)

## 2019-05-25 MED ORDER — SUCRALFATE 1 G PO TABS: 1.0000 g | ORAL_TABLET | Freq: Three times a day (TID) | ORAL | 1 refills | Status: DC | PRN

## 2019-05-25 MED ORDER — PANTOPRAZOLE SODIUM 40 MG PO TBEC: 40.0000 mg | DELAYED_RELEASE_TABLET | Freq: Every day | ORAL | 3 refills | Status: DC

## 2019-05-25 NOTE — Progress Notes (Signed)
Patient ID: Yesenia Thomas, female    DOB: 11-22-1980, 38 y.o.   MRN: [id number]  PCP: Hubbard Hartshorn, FNP  Chief Complaint  Patient presents with   Chest Pain   Numbness    right side arm and leg   Back Pain    radiates down leg    Subjective:   Yesenia Thomas is a 38 y.o. female, presents to clinic with CC of the following:  Multiple complaints - and repeatedly refused staff and nursing instructions to go to the ER or UC for her complaints she was told this many times this week, she was scheduled yesterday for appointment and did not show up and rescheduled for today.    Finally patient presents this Friday afternoon complaining of CP, abdominal pain, back pain, numbness and tingling, acid reflux, possibly urinary symptoms, shortness of breath.  Briefly discussed her symptoms and explained how most concerning to me is her chest pain and we will have to address that today and other chronic conditions or longstanding complaints such as her back pain for several years, will have to wait for another office visit and have encouraged her to schedule that so we can have an of time to appropriately work-up each acute and/or chronic complaint.  Patient complains of chest pain which has been ongoing for a while now although she cannot tell me exactly when it began, she does state that the CP got worse a few weeks ago.  Chest pain is located to her central chest, comes and goes, is described as sharp or sometimes squeezing, sometimes radiates to her back or feels like it is related to her back pain and other times it goes up and down her chest to her neck her head or shoots down her arms it also feels like GERD/acid reflux which has gotten worse.  Almost everything makes her chest pain worse and it is intermittently associated with shortness of breath.  Exacerbated by positional changes, exertion, eating, laying down, sitting up.  She states she has gone to the ER for it before and  she just wants someone to do MRI or a CT of her chest and of her back to figure out what it is. She states that she has several year history of acid reflux but it has recently worsened she states that her GERD is severe, she used to manage it with Zantac and only certain foods would make her reflux symptoms worse but now everything hurts to eat now, even water.  Over the past 2 weeks she has tried Tums, Alka-Seltzer and Pepcid and it is helped a little bit.  She is afraid that she is growing cancer or that her acid reflux has eroded into her body and is leaking everywhere.  When she gestures and points about the pain it is located at her lower central sternum and she does state that the acid reflux pain feels like it radiates everywhere up her neck to her head and sometimes shoots down her arms.  She also states she has been starting a diet plan and drinking weird supplements and smoothies and since then she has had some worsening of her pain and shortness of breath including SOB with her exercise, when she lays down or when she sits up. She also complains of back pain and hip pain and leg pain.  She has numbness and tingling and she also wonders if sometimes she is having a stroke.  When I first entered the room  and she told me she had to urinate she asked if I wanted a urine sample at that time and intermittently throughout the exam she is grabbing her low abdomen her suprapubic area under her pannus and also she had pointed to her low back also stated that she wondered if she had a UTI because she is sick with some any symptoms at this time she wanted make sure to give a urine sample when she went to the bathroom.  Patient has had no hematuria but she endorses urinary frequency all day and all night which is causing her to wake up every hour and she does not know why.  Although I have clearly stated that I need to do a good job at working up her chest pain and possibly her acid reflux and abdominal pain  today she continues to state that she has had back pain for years she wants a MRI of her back, she has anemia, she wants thyroid checked and she wants know she is iron deficient.   I explained to her that she needs return next week or another appointment to talk about many of these other things especially chronic conditions.  I do not know the patient, her PCP is present in the clinic but she has inquired about changing to another provider or changing to another clinic.   I explained the process of evaluating back pain and doing physical therapy conservative management before proceeding to an MRI if that is even indicated because insurance companies will not pay for it she is asked me if I could just "roll the dice" in order anyways.  I explained to her that is not appropriate I will not do that today.  There are no active problems to display for this patient.    Current Outpatient Medications:    Acetaminophen 500 MG coapsule, Take by mouth., Disp: , Rfl:    Pepsin POWD, by Does not apply route., Disp: , Rfl:    albuterol (PROVENTIL HFA;VENTOLIN HFA) 108 (90 Base) MCG/ACT inhaler, Inhale 2 puffs into the lungs every 6 (six) hours as needed for wheezing or shortness of breath. (Patient not taking: Reported on 05/25/2019), Disp: 1 Inhaler, Rfl: 2   albuterol (PROVENTIL) (2.5 MG/3ML) 0.083% nebulizer solution, Take 3 mLs (2.5 mg total) by nebulization every 6 (six) hours as needed for wheezing or shortness of breath. (Patient not taking: Reported on 05/25/2019), Disp: 150 mL, Rfl: 0   benzonatate (TESSALON PERLES) 100 MG capsule, Take 1 capsule (100 mg total) by mouth 3 (three) times daily as needed for cough. (Patient not taking: Reported on 05/25/2019), Disp: 30 capsule, Rfl: 0   ondansetron (ZOFRAN) 4 MG tablet, Take 1 tablet (4 mg total) by mouth every 8 (eight) hours as needed for nausea or vomiting. (Patient not taking: Reported on 05/25/2019), Disp: 20 tablet, Rfl: 0    promethazine-dextromethorphan (PROMETHAZINE-DM) 6.25-15 MG/5ML syrup, Take 5 mLs by mouth 4 (four) times daily as needed. (Patient not taking: Reported on 05/25/2019), Disp: 118 mL, Rfl: 0   Allergies  Allergen Reactions   Lactose Intolerance (Gi)    Percocet [Oxycodone-Acetaminophen]      Family History  Problem Relation Age of Onset   Hypertension Mother    Thrombosis Maternal Grandmother    Clotting disorder Maternal Grandfather    Deep vein thrombosis Maternal Grandfather      Social History   Socioeconomic History   Marital status: Unknown    Spouse name: Not on file   Number of  children: Not on file   Years of education: Not on file   Highest education level: Not on file  Occupational History   Not on file  Social Needs   Financial resource strain: Not on file   Food insecurity    Worry: Not on file    Inability: Not on file   Transportation needs    Medical: Not on file    Non-medical: Not on file  Tobacco Use   Smoking status: Never Smoker   Smokeless tobacco: Never Used  Substance and Sexual Activity   Alcohol use: Not Currently   Drug use: Never   Sexual activity: Yes  Lifestyle   Physical activity    Days per week: Not on file    Minutes per session: Not on file   Stress: Not on file  Relationships   Social connections    Talks on phone: Not on file    Gets together: Not on file    Attends religious service: Not on file    Active member of club or organization: Not on file    Attends meetings of clubs or organizations: Not on file    Relationship status: Not on file   Intimate partner violence    Fear of current or ex partner: Not on file    Emotionally abused: Not on file    Physically abused: Not on file    Forced sexual activity: Not on file  Other Topics Concern   Not on file  Social History Narrative   Not on file    I personally reviewed active problem list, medication list, allergies, family history, social  history, health maintenance, notes from last several encounters, lab results, imaging with the patient/caregiver today.  Review of Systems  Constitutional: Negative.  Negative for chills, diaphoresis, fatigue and fever.  HENT: Negative.   Eyes: Negative.   Respiratory: Negative.  Negative for cough.   Cardiovascular: Negative.   Gastrointestinal: Positive for abdominal pain. Negative for constipation, diarrhea, nausea and vomiting.  Endocrine: Positive for polyuria.  Genitourinary: Positive for frequency. Negative for flank pain.  Musculoskeletal: Positive for back pain. Negative for joint swelling and myalgias.  Skin: Negative.  Negative for color change, pallor and rash.  Allergic/Immunologic: Negative.   Neurological: Negative.  Negative for syncope, light-headedness and headaches.  Hematological: Negative.   Psychiatric/Behavioral: Negative.   All other systems reviewed and are negative.      Objective:   Vitals:   05/25/19 1411  BP: (!) 134/92  Pulse: 86  Resp: 14  Temp: 98 F (36.7 C)  SpO2: 99%  Weight: (!) 386 lb 1.6 oz (175.1 kg)  Height: 5' 6"  (1.676 m)    Body mass index is 62.32 kg/m.  Physical Exam Vitals signs and nursing note reviewed.  Constitutional:      General: She is not in acute distress.    Appearance: She is well-developed. She is obese. She is not ill-appearing, toxic-appearing or diaphoretic.     Comments: Super morbidly obese, well appearing, on phone  HENT:     Head: Normocephalic and atraumatic.     Nose: Nose normal.  Eyes:     General: No scleral icterus.       Right eye: No discharge.        Left eye: No discharge.     Conjunctiva/sclera: Conjunctivae normal.  Neck:     Trachea: No tracheal deviation.  Cardiovascular:     Rate and Rhythm: Normal rate and regular rhythm.  Pulses: Normal pulses.          Radial pulses are 2+ on the right side and 2+ on the left side.       Dorsalis pedis pulses are 2+ on the right side and 2+  on the left side.       Posterior tibial pulses are 2+ on the right side and 2+ on the left side.     Heart sounds: Normal heart sounds. No murmur. No friction rub. No gallop.   Pulmonary:     Effort: No respiratory distress.     Breath sounds: No stridor. No wheezing, rhonchi or rales.     Comments: Exam limited by body habitus and poor inspiratory effort, speaking in full and complete sentences, no tachypnea, no retractions or accessory muscle use, no wheeze, rales or rhonchi auscultated on exam, symmetrical chest expansion Chest:     Chest wall: No tenderness.  Abdominal:     General: Bowel sounds are normal.     Tenderness: There is no abdominal tenderness. There is no right CVA tenderness or left CVA tenderness.     Comments: Super morbidly obese with protuberant abdomen and pannus physical exam limited by this body habitus  Musculoskeletal: Normal range of motion.     Right lower leg: 1+ Edema present.     Left lower leg: 1+ Edema present.     Comments: No midline tenderness from cervical to lumbar spine, no paraspinal muscle tenderness to palpation, negative straight leg raise bilaterally, good range of motion of back and hips, patient getting on and off exam table walking around the room playing on her cell phone and then getting back on the exam table Normal gait   Skin:    General: Skin is warm and dry.     Findings: No rash.  Neurological:     Mental Status: She is alert.     Motor: No abnormal muscle tone.     Coordination: Coordination normal.  Psychiatric:        Behavior: Behavior normal.      Results for orders placed or performed in visit on 11/28/18  Novel Coronavirus, NAA ([facility])  Drive up testing site only  Result Value Ref Range   SARS-CoV-2, NAA Not Detected Not Detected   ECG:   Done 05/25/2019 at 1443 Normal sinus rhythm, normal ratee 73, normal intervals, normal axis, non-specific t-wave abnormality in lead III, no continuous ST elevation or  depression  No ECG available in EMR to compare to, in care everywhere can only see interpretation of ECG done 08/05/18 with ER presentation for CP with NSR  Results for orders placed or performed in visit on 05/25/19  POCT urinalysis dipstick  Result Value Ref Range   Color, UA drk yellow    Clarity, UA cloudy    Glucose, UA Negative Negative   Bilirubin, UA neg    Ketones, UA neg    Spec Grav, UA 1.015 1.010 - 1.025   Blood, UA neg    pH, UA 6.5 5.0 - 8.0   Protein, UA Negative Negative   Urobilinogen, UA 0.2 0.2 or 1.0 E.U./dL   Nitrite, UA neg    Leukocytes, UA Negative Negative   Appearance clody    Odor foul         Assessment & Plan:      ICD-10-CM   1. Chest pain, unspecified type  R07.9 EKG 12-Lead    COMPLETE METABOLIC PANEL WITH GFR    CBC with Differential/Platelet  DG Chest 2 View    pantoprazole (PROTONIX) 40 MG tablet  2. Abdominal pain, unspecified abdominal location  R10.9 POCT urinalysis dipstick    COMPLETE METABOLIC PANEL WITH GFR    CBC with Differential/Platelet  3. Gastroesophageal reflux disease with esophagitis  G83.6 COMPLETE METABOLIC PANEL WITH GFR    CBC with Differential/Platelet    pantoprazole (PROTONIX) 40 MG tablet    sucralfate (CARAFATE) 1 g tablet  4. Frequency of urination and polyuria  R35.0 Hemoglobin A1c   R35.8   5. Shortness of breath  R06.02   6. Anemia, unspecified type  D64.9 Iron, TIBC and Ferritin Panel  7. Back pain, unspecified back location, unspecified back pain laterality, unspecified chronicity  M54.9    encouraged her to return for another visit to address, normal gait, good ROM of back, neg SLR - can wait  8. Morbid obesity with BMI of 60.0-69.9, adult (HCC)  E66.01    Z68.44     I do suspect chest pain is likely related to GERD with esophagitis and or gastritis - possibly gastric ulcer.  Will start patient on Protonix and Carafate I have explained to her she can continue to take over-the-counter Tums or  Pepcid also as needed, talked about lifestyle changes for GERD including dietary restrictions, EKG was reassuring today, have no concern for ACS.  Will send for chest x-ray since physical exam breath sounds were somewhat limited by her body habitus and she has had some cough and wheeze and use of inhaler before I highly doubt an infectious etiology of her chest pain shortness of breath but would like to rule it out.   She possibly have some reactive airway disease or restrictive lung disease secondary to body habitus.  I am fairly confident that exertional shortness of breath is likely due to deconditioning and her morbid obesity.  She did have very slight lower extremity edema which was unchanged from her baseline and I highly doubt congestive heart failure, patient appears euvolemic today pulse ox is reassuring, and walking around the clinic and around the exam room I saw no exertional dyspnea.  I explained to the patient that she should come back for another visit dedicated to her back pain since she has had it for many years I suspect that may also be related to her weight  Her other low back and abdominal pain compliant - UA was unremarkable and do not suspect UTI or pyelonephritis I will obtain basic labs to further assess Pt briefly endorsed polyuria and nocturia and A1C was added. She continued to add on many multiple additional complaints and with her blood work today the only other thing I felt was appropriate to add on and check was her iron panel because of her reports of tingling and shortness of breath and reported history of anemia will check that with a CBC  Patient is very difficult to get a history from and to redirect and explained the plan to today.  She at the conclusion of our visit was given paperwork, sent for her labs and also was instructed to get chest x-ray.  She then expressed to staff, "that's it? she didnt even do anything" other staff members also came to my office stating  that the patient wanted cardiac enzymes and wanted thyroid labs drawn. Although the patient is morbidly obese and likely needs work-up for metabolic conditions I explained her multiple times that I was not going to work-up her chronic conditions and her multiple acute complaints today.  She was explained to multiple times over the phone that we will not work-up it up in office any potentially life-threatening conditions or complaints such as chest pain I will not draw cardiac enzymes with possible delay of getting results and therefore potential for bad outcomes for the patient.  She chose to not go to the ER multiple times all week long and I am not going to do cardiac testing in the office I highly doubt ACS CHF PE.  Per ER standard she is PERC negative, her history is not suggestive of ACS and she was encouraged to go to the ER if she had any worsening chest pain with exertion.  I have reviewed several of her visits through care everywhere, ER visits and prior office visits with this office since establishing earlier this year (march with Raquel Sarna NP).  ER documentation from [facility] notes that the patient requested CTs and MRIs to work her up but they explained it was not necessary and emergent conditions were ruled out adequately with their work-up.  Today she is asked for the same thing.  Of note she has asked her PCP multiple times for multiple notes out of work.  When her requests were not met she began seeking another provider at the same clinic and other clinics.    Greater than 50% of this visit was spent in direct face-to-face counseling, obtaining history and physical, discussing and educating pt on treatment plan.  Total time of this visit was 45 min (+).  Remainder of time involved but was not limited to reviewing chart (recent and pertinent OV notes and labs), documentation in EMR, and coordinating care and treatment plan.   Delsa Grana, PA-C 05/25/19 2:55 PM

## 2019-05-25 NOTE — Patient Instructions (Signed)
EKG was reassuring We will call you with your chest x-ray results Start taking Protonix on empty stomach first thing in the morning for the next 2 weeks, take Carafate as needed as prescribed for the next week or so to help with pain when eating. It will take around 2 weeks for the medicines to improve your symptoms If do not improve between 2 to 4 weeks from now after following all medical instructions then we may refer you to GI or proceed with other testing  Please return to address your back pain and your other concerns  I have added on labs to check for diabetes and recheck your anemia, but other requested labs and other routine care needs to be done at regular scheduled intervals and office visits with your PCP.  It does require multiple visits to get your concerns and issues worked up and controlled. At routine visits and at acute visits only a few of your complaints or diagnosis can be worked up appropriately and correctly.  So it will require multiple visits if you have many chronic conditions, uncontrolled conditions, chronic pain, and multiple new concerns.  Please see the acid reflux handout below and work on the diet and lifestyle changes   Food Choices for Gastroesophageal Reflux Disease, Adult When you have gastroesophageal reflux disease (GERD), the foods you eat and your eating habits are very important. Choosing the right foods can help ease your discomfort. Think about working with a nutrition specialist (dietitian) to help you make good choices. What are tips for following this plan?  Meals  Choose healthy foods that are low in fat, such as fruits, vegetables, whole grains, low-fat dairy products, and lean meat, fish, and poultry.  Eat small meals often instead of 3 large meals a day. Eat your meals slowly, and in a place where you are relaxed. Avoid bending over or lying down until 2-3 hours after eating.  Avoid eating meals 2-3 hours before bed.  Avoid drinking a lot  of liquid with meals.  Cook foods using methods other than frying. Bake, grill, or broil food instead.  Avoid or limit: ? Chocolate. ? Peppermint or spearmint. ? Alcohol. ? Pepper. ? Black and decaffeinated coffee. ? Black and decaffeinated tea. ? Bubbly (carbonated) soft drinks. ? Caffeinated energy drinks and soft drinks.  Limit high-fat foods such as: ? Fatty meat or fried foods. ? Whole milk, cream, butter, or ice cream. ? Nuts and nut butters. ? Pastries, donuts, and sweets made with butter or shortening.  Avoid foods that cause symptoms. These foods may be different for everyone. Common foods that cause symptoms include: ? Tomatoes. ? Oranges, lemons, and limes. ? Peppers. ? Spicy food. ? Onions and garlic. ? Vinegar. Lifestyle  Maintain a healthy weight. Ask your doctor what weight is healthy for you. If you need to lose weight, work with your doctor to do so safely.  Exercise for at least 30 minutes for 5 or more days each week, or as told by your doctor.  Wear loose-fitting clothes.  Do not smoke. If you need help quitting, ask your doctor.  Sleep with the head of your bed higher than your feet. Use a wedge under the mattress or blocks under the bed frame to raise the head of the bed. Summary  When you have gastroesophageal reflux disease (GERD), food and lifestyle choices are very important in easing your symptoms.  Eat small meals often instead of 3 large meals a day. Eat your meals slowly,  and in a place where you are relaxed.  Limit high-fat foods such as fatty meat or fried foods.  Avoid bending over or lying down until 2-3 hours after eating.  Avoid peppermint and spearmint, caffeine, alcohol, and chocolate. This information is not intended to replace advice given to you by your health care provider. Make sure you discuss any questions you have with your health care provider. Document Released: 02/29/2012 Document Revised: 12/21/2018 Document Reviewed:  10/05/2016 Elsevier Patient Education  2020 Elsevier Inc.   Nonspecific Chest Pain Chest pain can be caused by many different conditions. Some causes of chest pain can be life-threatening. These will require treatment right away. Serious causes of chest pain include:  Heart attack.  A tear in the body's main blood vessel.  Redness and swelling (inflammation) around your heart.  Blood clot in your lungs. Other causes of chest pain may not be so serious. These include:  Heartburn.  Anxiety or stress.  Damage to bones or muscles in your chest.  Lung infections. Chest pain can feel like:  Pain or discomfort in your chest.  Crushing, pressure, aching, or squeezing pain.  Burning or tingling.  Dull or sharp pain that is worse when you move, cough, or take a deep breath.  Pain or discomfort that is also felt in your back, neck, jaw, shoulder, or arm, or pain that spreads to any of these areas. It is hard to know whether your pain is caused by something that is serious or something that is not so serious. So it is important to see your doctor right away if you have chest pain. Follow these instructions at home: Medicines  Take over-the-counter and prescription medicines only as told by your doctor.  If you were prescribed an antibiotic medicine, take it as told by your doctor. Do not stop taking the antibiotic even if you start to feel better. Lifestyle   Rest as told by your doctor.  Do not use any products that contain nicotine or tobacco, such as cigarettes, e-cigarettes, and chewing tobacco. If you need help quitting, ask your doctor.  Do not drink alcohol.  Make lifestyle changes as told by your doctor. These may include: ? Getting regular exercise. Ask your doctor what activities are safe for you. ? Eating a heart-healthy diet. A diet and nutrition specialist (dietitian) can help you to learn healthy eating options. ? Staying at a healthy weight. ? Treating  diabetes or high blood pressure, if needed. ? Lowering your stress. Activities such as yoga and relaxation techniques can help. General instructions  Pay attention to any changes in your symptoms. Tell your doctor about them or any new symptoms.  Avoid any activities that cause chest pain.  Keep all follow-up visits as told by your doctor. This is important. You may need more testing if your chest pain does not go away. Contact a doctor if:  Your chest pain does not go away.  You feel depressed.  You have a fever. Get help right away if:  Your chest pain is worse.  You have a cough that gets worse, or you cough up blood.  You have very bad (severe) pain in your belly (abdomen).  You pass out (faint).  You have either of these for no clear reason: ? Sudden chest discomfort. ? Sudden discomfort in your arms, back, neck, or jaw.  You have shortness of breath at any time.  You suddenly start to sweat, or your skin gets clammy.  You feel sick  to your stomach (nauseous).  You throw up (vomit).  You suddenly feel lightheaded or dizzy.  You feel very weak or tired.  Your heart starts to beat fast, or it feels like it is skipping beats. These symptoms may be an emergency. Do not wait to see if the symptoms will go away. Get medical help right away. Call your local emergency services (911 in the [place] Do not drive yourself to the hospital. Summary  Chest pain can be caused by many different conditions. The cause may be serious and need treatment right away. If you have chest pain, see your doctor right away.  Follow your doctor's instructions for taking medicines and making lifestyle changes.  Keep all follow-up visits as told by your doctor. This includes visits for any further testing if your chest pain does not go away.  Be sure to know the signs that show that your condition has become worse. Get help right away if you have these symptoms. This information is not  intended to replace advice given to you by your health care provider. Make sure you discuss any questions you have with your health care provider. Document Released: 02/16/2008 Document Revised: 03/02/2018 Document Reviewed: 03/02/2018 Elsevier Patient Education  2020 [employer].

## 2019-05-26 LAB — CBC WITH DIFFERENTIAL/PLATELET
Absolute Monocytes: 679 cells/uL (ref 200–950)
Basophils Absolute: 29 cells/uL (ref 0–200)
Basophils Relative: 0.3 %
Eosinophils Absolute: 194 cells/uL (ref 15–500)
Eosinophils Relative: 2 %
HCT: 32.3 % — ABNORMAL LOW (ref 35.0–45.0)
Hemoglobin: 9.5 g/dL — ABNORMAL LOW (ref 11.7–15.5)
Lymphs Abs: 3094 cells/uL (ref 850–3900)
MCH: 20.4 pg — ABNORMAL LOW (ref 27.0–33.0)
MCHC: 29.4 g/dL — ABNORMAL LOW (ref 32.0–36.0)
MCV: 69.5 fL — ABNORMAL LOW (ref 80.0–100.0)
MPV: 8.9 fL (ref 7.5–12.5)
Monocytes Relative: 7 %
Neutro Abs: 5704 cells/uL (ref 1500–7800)
Neutrophils Relative %: 58.8 %
Platelets: 443 10*3/uL — ABNORMAL HIGH (ref 140–400)
RBC: 4.65 10*6/uL (ref 3.80–5.10)
RDW: 17.6 % — ABNORMAL HIGH (ref 11.0–15.0)
Total Lymphocyte: 31.9 %
WBC: 9.7 10*3/uL (ref 3.8–10.8)

## 2019-05-26 LAB — COMPLETE METABOLIC PANEL WITH GFR
AG Ratio: 1.4 (calc) (ref 1.0–2.5)
ALT: 17 U/L (ref 6–29)
AST: 15 U/L (ref 10–30)
Albumin: 3.9 g/dL (ref 3.6–5.1)
Alkaline phosphatase (APISO): 55 U/L (ref 31–125)
BUN: 9 mg/dL (ref 7–25)
CO2: 27 mmol/L (ref 20–32)
Calcium: 9 mg/dL (ref 8.6–10.2)
Chloride: 106 mmol/L (ref 98–110)
Creat: 0.64 mg/dL (ref 0.50–1.10)
GFR, Est African American: 131 mL/min/{1.73_m2} (ref >=60)
GFR, Est Non African American: 113 mL/min/{1.73_m2} (ref >=60)
Globulin: 2.8 g/dL (calc) (ref 1.9–3.7)
Glucose, Bld: 90 mg/dL (ref 65–99)
Potassium: 3.8 mmol/L (ref 3.5–5.3)
Sodium: 141 mmol/L (ref 135–146)
Total Bilirubin: 0.3 mg/dL (ref 0.2–1.2)
Total Protein: 6.7 g/dL (ref 6.1–8.1)

## 2019-05-26 LAB — IRON,TIBC AND FERRITIN PANEL
%SAT: 5 % (calc) — ABNORMAL LOW (ref 16–45)
Ferritin: 12 ng/mL — ABNORMAL LOW (ref 16–154)
Iron: 19 ug/dL — ABNORMAL LOW (ref 40–190)
TIBC: 371 mcg/dL (calc) (ref 250–450)

## 2019-05-26 LAB — HEMOGLOBIN A1C
Hgb A1c MFr Bld: 5.6 % of total Hgb (ref <5.7)
Mean Plasma Glucose: 114 (calc)
eAG (mmol/L): 6.3 (calc)

## 2019-05-28 ENCOUNTER — Other Ambulatory Visit: Payer: Self-pay | Admitting: Family Medicine

## 2019-05-28 DIAGNOSIS — D509 Iron deficiency anemia, unspecified: Secondary | ICD-10-CM

## 2019-05-28 NOTE — Progress Notes (Signed)
Need to r/o blood in stool, get hx of menses and pt to f/up with hematology She is returning this week for other CC.

## 2019-05-30 ENCOUNTER — Inpatient Hospital Stay: Payer: 59 | Admitting: Oncology

## 2019-05-30 ENCOUNTER — Ambulatory Visit: Payer: Managed Care, Other (non HMO) | Admitting: Family Medicine

## 2019-05-31 ENCOUNTER — Inpatient Hospital Stay: Payer: 59 | Attending: Oncology | Admitting: Oncology

## 2020-07-25 ENCOUNTER — Telehealth: Payer: Self-pay | Admitting: Family Medicine

## 2020-07-28 IMAGING — CR DG CHEST 2V
1 series · 2 of 2 positions shown · non-contrast
Comparison: 12/08/2018

CLINICAL DATA: Chest pain

EXAM:
CHEST - 2 VIEW

[Series 1: dg chest 2 view · 0.14mm/px · 2 of 2 slices shown]
[im 1/2]
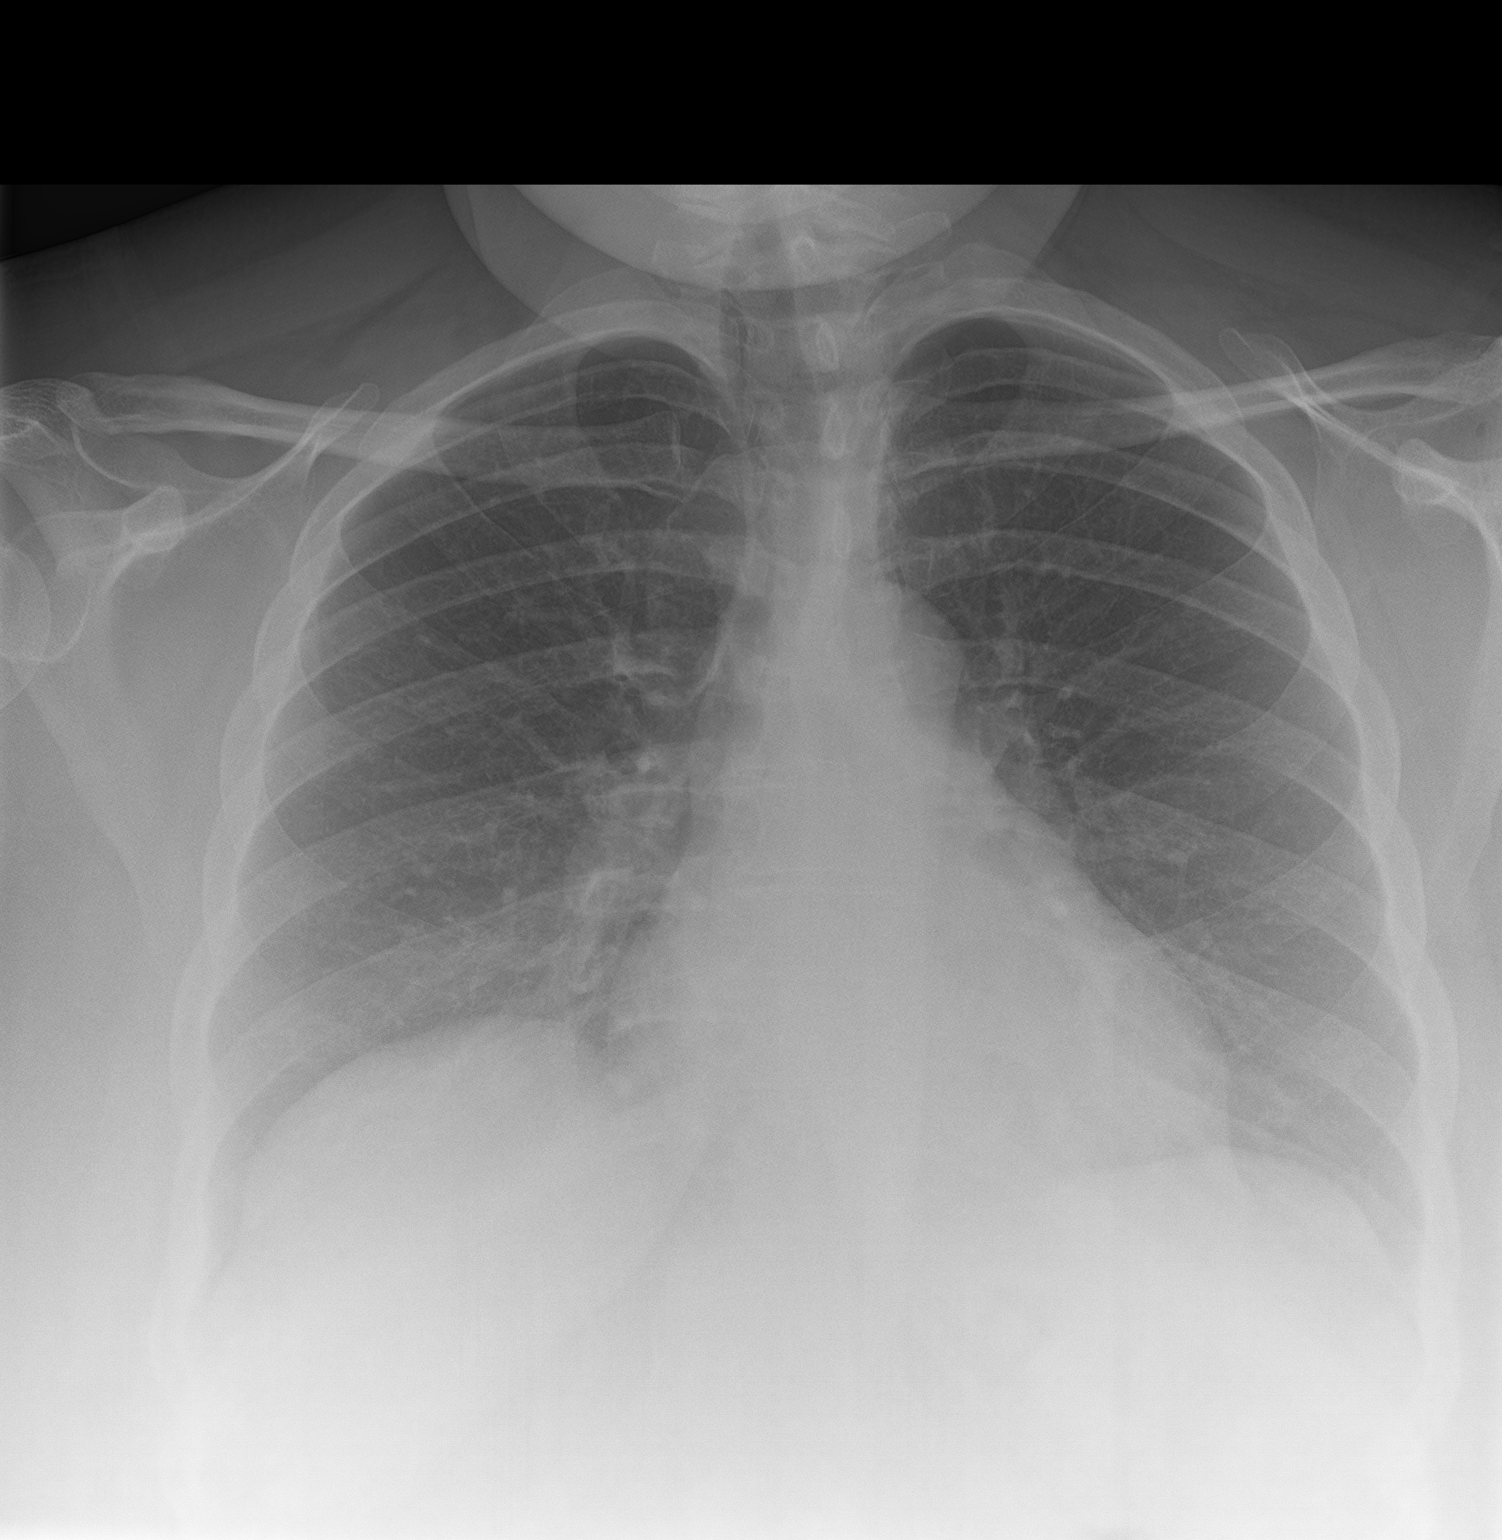
[im 2/2]
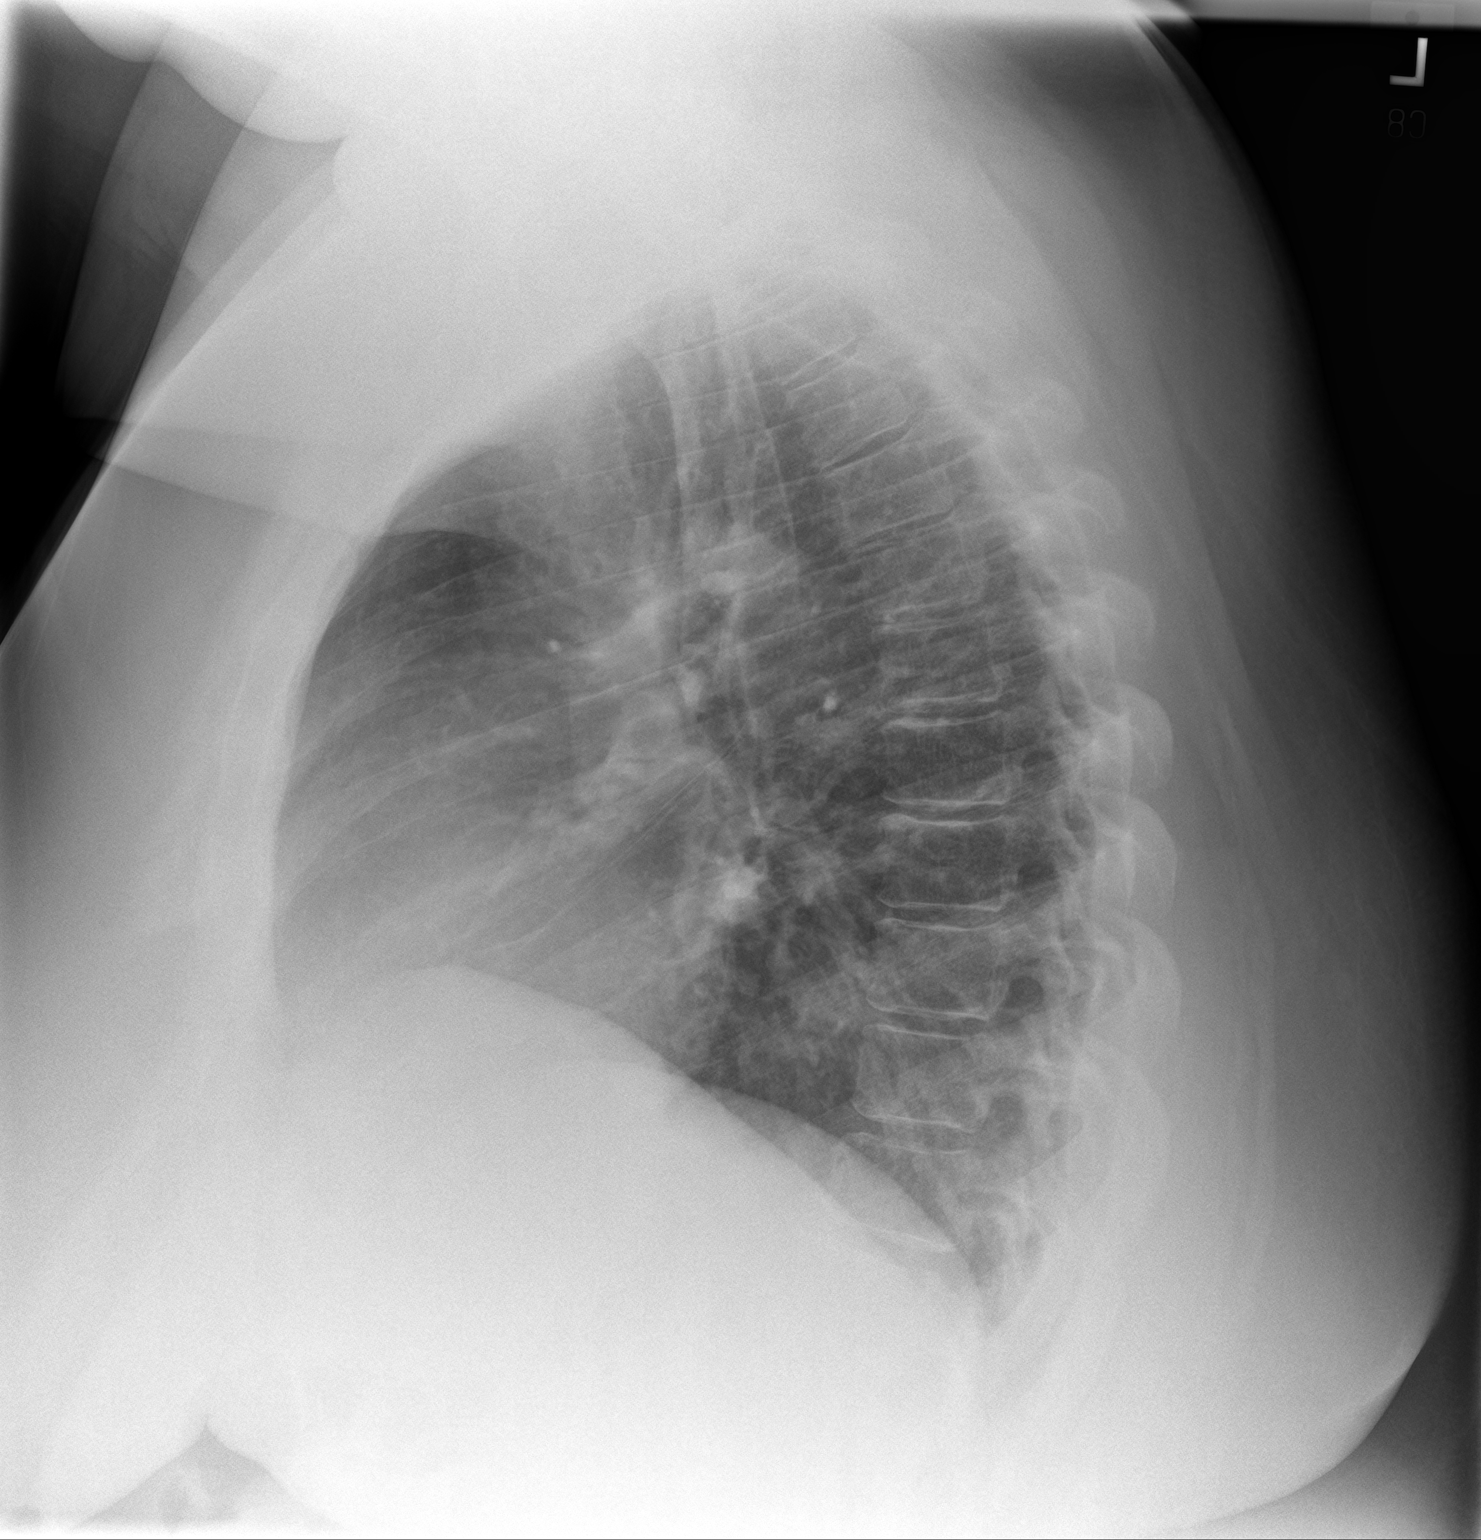

[2 of 2 positions shown; findings below may reference images not displayed]

FINDINGS: The heart size and mediastinal contours are within normal limits.
Both lungs are clear. The visualized skeletal structures are
unremarkable.
IMPRESSION: No active cardiopulmonary disease.

## 2020-07-29 ENCOUNTER — Ambulatory Visit: Payer: Medicaid Other | Admitting: Internal Medicine

## 2020-07-30 ENCOUNTER — Ambulatory Visit: Payer: Medicaid Other | Admitting: Internal Medicine

## 2020-09-25 ENCOUNTER — Other Ambulatory Visit: Payer: 59

## 2020-11-10 ENCOUNTER — Ambulatory Visit: Payer: Self-pay | Admitting: Family Medicine

## 2020-11-10 NOTE — Progress Notes (Deleted)
    SUBJECTIVE:   CHIEF COMPLAINT / HPI:   CHEST PAIN - seen previously 05/2019 for same, thought to be 2/2 GERD vs deconditioning.  Time since onset: Duration:{Blank single:19197::"days","weeks","months"} Onset: {Blank single:19197::"sudden","gradual"} Quality: {Blank multiple:19196::"sharp","dull","aching","burning","cramping","ill-defined","itchy","pressure-like","pulling","shooting","sore","stabbing","tender","tearing","throbbing"} Severity: {Blank single:19197::"mild","moderate","severe","1/10","2/10","3/10","4/10","5/10","6/10","7/10","8/10","9/10","10/10"} Location: {Blank single:19197::"substernal","left para substernal","right para substernal","subxiphoid","upper right","upper left","upper sternum"} Radiation: {Blank single:19197::"none","neck","jaw","left arm","right arm","back"} Episode duration:  Frequency: {Blank single:19197::"constant","intermittent","occasional","rare","every few minutes","a few times a hour","a few times a day","a few times a week","a few times a month","a few times a year"} Related to exertion: {Blank single:19197::"yes","no"} Activity when pain started:  Trauma: {Blank single:19197::"yes","no"} Anxiety/recent stressors: {Blank single:19197::"yes","no"} Aggravating factors:  Alleviating factors:  Status: {Blank multiple:19196::"better","worse","stable","fluctuating"} Treatments attempted: {Blank multiple:19196::"nothing","APAP","ibuprofen","antacids"}  Current pain status: {Blank single:19197::"pain free","in pain","chest wall tender"} Shortness of breath: {Blank single:19197::"yes","no"} Cough: {Blank single:19197::"yes","no","yes, productive","yes, non-productive"} Nausea: {Blank single:19197::"yes","no"} Diaphoresis: {Blank single:19197::"yes","no"} Heartburn: {Blank single:19197::"yes","no"} Palpitations: {Blank single:19197::"yes","no"}   OBJECTIVE:   There were no vitals taken for this visit.  ***  ASSESSMENT/PLAN:   No  problem-specific Assessment & Plan notes found for this encounter.     Caro Laroche, DO [facility]

## 2020-11-17 ENCOUNTER — Ambulatory Visit: Payer: Medicaid Other

## 2020-12-15 ENCOUNTER — Ambulatory Visit: Payer: Self-pay | Admitting: *Deleted

## 2020-12-15 ENCOUNTER — Ambulatory Visit: Payer: Medicaid Other | Admitting: Family Medicine

## 2020-12-15 NOTE — Telephone Encounter (Signed)
Pt rescheduled with Leighton Roach. Please close the chart for me it will not let me do it. Thank you

## 2020-12-15 NOTE — Telephone Encounter (Addendum)
Patient calls b/c she is late for her 1:00 pm appointment. C/O bilateral arm and leg pain/numbness/tingling for "months now". Stated she was walking in and disconnected.

## 2020-12-16 ENCOUNTER — Encounter: Payer: Self-pay | Admitting: Physician Assistant

## 2020-12-16 ENCOUNTER — Ambulatory Visit: Payer: Medicaid Other | Admitting: Physician Assistant

## 2020-12-16 ENCOUNTER — Other Ambulatory Visit: Payer: Self-pay

## 2020-12-16 VITALS — BP 132/84 | Resp 16 | Ht 68.0 in | Wt 371.3 lb

## 2020-12-16 DIAGNOSIS — R202 Paresthesia of skin: Secondary | ICD-10-CM

## 2020-12-16 DIAGNOSIS — M25559 Pain in unspecified hip: Secondary | ICD-10-CM | POA: Diagnosis not present

## 2020-12-16 DIAGNOSIS — R29818 Other symptoms and signs involving the nervous system: Secondary | ICD-10-CM

## 2020-12-16 DIAGNOSIS — M79604 Pain in right leg: Secondary | ICD-10-CM | POA: Diagnosis not present

## 2020-12-16 DIAGNOSIS — B351 Tinea unguium: Secondary | ICD-10-CM

## 2020-12-16 DIAGNOSIS — R200 Anesthesia of skin: Secondary | ICD-10-CM | POA: Diagnosis not present

## 2020-12-16 NOTE — Progress Notes (Signed)
Established patient visit   Patient: Yesenia Thomas   DOB: 1981/04/25   40 y.o. Female  MRN: [id number] Visit Date: 12/16/2020  Today's healthcare provider: Trey Sailors, PA-C   Chief Complaint  Patient presents with  . Hip Pain  . Hand Pain    numbness   Subjective    HPI HPI    Hand Pain    Comments: numbness       Last edited by Marcos Eke, CMA on 12/16/2020  3:36 PM. (History)      Patient presents with hip pain for several years in both sides of her hips. She reports symptoms of numbness in her legs and her arms. She reports this will happen intermittently. She reports her right leg went out when she was walking. She reports this happened for 5 minutes and then returned after 5 minutes. She reports her right arm goes numb 2-3 times per day. She reports she is able to move her arm when this happens. Reports her pinky finger will go numb in her left hand. She reports her left leg is unaffected. She reports her feet will sometimes experience numbness and tingling. She denies changes in her vision.   She reports having to urinate frequently. She reports during the daytime she is not having as many issues. She does report holding urine because she is busy. She reports she has stopped drinking as much water. She reports at night she feels her bladder getting filled up from her legs.      Medications: Outpatient Medications Prior to Visit  Medication Sig  . Acetaminophen 500 MG coapsule Take by mouth.  . [DISCONTINUED] albuterol (PROVENTIL HFA;VENTOLIN HFA) 108 (90 Base) MCG/ACT inhaler Inhale 2 puffs into the lungs every 6 (six) hours as needed for wheezing or shortness of breath.  . [DISCONTINUED] pantoprazole (PROTONIX) 40 MG tablet Take 1 tablet (40 mg total) by mouth daily.  . [DISCONTINUED] Pepsin POWD by Does not apply route.  . [DISCONTINUED] sucralfate (CARAFATE) 1 g tablet Take 1 tablet (1 g total) by mouth 3 (three) times daily with meals as needed.  for GERD/reflux/epigastric pain when eating or drinking   No facility-administered medications prior to visit.    Review of Systems  All other systems reviewed and are negative.      Objective    BP 132/84   Resp 16   Ht 5\' 8"  (1.727 m)   Wt (!) 371 lb 4.8 oz (168.4 kg)   SpO2 99%   BMI 56.46 kg/m     Physical Exam Constitutional:      Appearance: Normal appearance.  Cardiovascular:     Rate and Rhythm: Normal rate and regular rhythm.     Heart sounds: Normal heart sounds.  Pulmonary:     Effort: Pulmonary effort is normal.     Breath sounds: Normal breath sounds.  Musculoskeletal:        General: No swelling, tenderness or deformity. Normal range of motion.  Skin:    General: Skin is warm and dry.  Neurological:     Mental Status: She is alert and oriented to person, place, and time. Mental status is at baseline.     Motor: No weakness.  Psychiatric:        Mood and Affect: Mood normal.        Behavior: Behavior normal.       No results found for any visits on 12/16/20.  Assessment & Plan    1.  Hip pain   2. Pain of right lower extremity   3. Numbness and tingling  Refer to neurology, suspicion for MS.   - Ambulatory referral to Neurology  4. Suspected sleep apnea  - Split night study; Future  5. Onychomycosis  Advised she can try pill but may need toenail removed. Will send to podiatry.   - Ambulatory referral to Podiatry   Return if symptoms worsen or fail to improve.      I spent 30 minutes dedicated to the care of this patient on the date of this encounter to include pre-visit review of records, face-to-face time with the patient discussing weakness, and post visit ordering of testing.   Trey Sailors, PA-C  [facility] [phone] (phone) [phone] (fax)  [facility]

## 2020-12-17 NOTE — Patient Instructions (Signed)
Weakness Weakness is a lack of strength. You may feel weak all over your body (generalized), or you may feel weak in one part of your body (focal). There are many potential causes of weakness. Sometimes, the cause of your weakness may not be known. Some causes of weakness can be serious, so it is important to see your doctor. Follow these instructions at home: Activity  Rest as needed.  Try to get enough sleep. Most adults need 7-8 hours of sleep each night. Talk to your doctor about how much sleep you need each night.  Do exercises, such as arm curls and leg raises, for 30 minutes at least 2 days a week or as told by your doctor.  Think about working with a physical therapist or trainer to help you get stronger. General instructions  Take over-the-counter and prescription medicines only as told by your doctor.  Eat a healthy, well-balanced diet. This includes: ? Proteins to build muscles, such as lean meats and fish. ? Fresh fruits and vegetables. ? Carbohydrates to boost energy, such as whole grains.  Drink enough fluid to keep your pee (urine) pale yellow.  Keep all follow-up visits as told by your doctor. This is important.   Contact a doctor if:  Your weakness does not get better or it gets worse.  Your weakness affects your ability to: ? Think clearly. ? Do your normal daily activities. Get help right away if you:  Have sudden weakness on one side of your face or body.  Have chest pain.  Have trouble breathing or shortness of breath.  Have problems with your vision.  Have trouble talking or swallowing.  Have trouble standing or walking.  Are light-headed.  Pass out (lose consciousness). Summary  Weakness is a lack of strength. You may feel weak all over your body or just in one part of your body.  There are many potential causes of weakness. Sometimes, the cause of your weakness may not be known.  Rest as needed, and try to get enough sleep. Most adults  need 7-8 hours of sleep each night.  Eat a healthy, well-balanced diet. This information is not intended to replace advice given to you by your health care provider. Make sure you discuss any questions you have with your health care provider. Document Revised: 04/05/2018 Document Reviewed: 04/05/2018 Elsevier Patient Education  2021 Elsevier Inc.  

## 2020-12-19 ENCOUNTER — Ambulatory Visit: Payer: Medicaid Other | Admitting: Podiatry

## 2020-12-26 ENCOUNTER — Ambulatory Visit: Payer: Self-pay | Admitting: *Deleted

## 2020-12-26 NOTE — Telephone Encounter (Signed)
  Reason for Disposition . Dizziness is a chronic symptom (recurrent or ongoing AND present > 4 weeks)  Protocols used: DIZZINESS [employer]

## 2020-12-26 NOTE — Telephone Encounter (Addendum)
Pt called in to make a follow up appt.   She was in on 12/16/2020 and saw Osvaldo Angst.   She had a list of problems so Ricki Rodriguez could only see her for 2 problems that day and that she would need to schedule another time to continue dealing with her other issues.   Pt was given several referrals to various doctors one of them being a neurologist but she hasn't followed up on that.  While she was making the appt with the agent she mentioned being dizzy so the agent transferred the call to me.     She has been having this dizziness "for years".  See triage notes.   It was not anything acute.    She c/o having bilateral leg pain, and pain in her back that has been going on for years.    Ricki Rodriguez did not have an openings until 01/25/2021 and pt requested an appt with the next available person because "she wants to get some blood work done to see what is wrong with her".  I made her an appt with Macario Carls Just, NP for 01/02/2021 at 10:00 in office visit.   Covid questionnaire completed.   Answer Assessment - Initial Assessment Questions 1. DESCRIPTION: "Describe your dizziness."     If I stand up I'm very dizzy.   I've been cleaning a lot the last 2 days so I've been on my feet a lot.   I almost fell twice.   My legs go out a lot.   I've been having dizziness for years.  When I stand for a long period of time.   I have dizziness where I can't stand for periods of time for many years now.  I'm having pains in my hips legs and back.  Last time I came in I brought a whole list of things wrong with me.  She could only do 2 problems at a time and to make another appt.     She thinks I have M.S.  I'm having a noise in my ear on the right.   "It is a swooshing sound in my ear".   It's on and off.   If I press on the back of my ear it stops.   2. LIGHTHEADED: "Do you feel lightheaded?" (e.g., somewhat faint, woozy, weak upon standing)     *No Answer* 3. VERTIGO: "Do you feel like either you or the room is  spinning or tilting?" (i.e. vertigo)     *No Answer* 4. SEVERITY: "How bad is it?"  "Do you feel like you are going to faint?" "Can you stand and walk?"   - MILD: Feels slightly dizzy, but walking normally.   - MODERATE: Feels very unsteady when walking, but not falling; interferes with normal activities (e.g., school, work) .   - SEVERE: Unable to walk without falling, or requires assistance to walk without falling; feels like passing out now.      *No Answer* 5. ONSET:  "When did the dizziness begin?"     *No Answer* 6. AGGRAVATING FACTORS: "Does anything make it worse?" (e.g., standing, change in head position)     *No Answer* 7. HEART RATE: "Can you tell me your heart rate?" "How many beats in 15 seconds?"  (Note: not all patients can do this)       *No Answer* 8. CAUSE: "What do you think is causing the dizziness?"     *No Answer* 9. RECURRENT SYMPTOM: "Have you had dizziness before?" If  Yes, ask: "When was the last time?" "What happened that time?"     *No Answer* 10. OTHER SYMPTOMS: "Do you have any other symptoms?" (e.g., fever, chest pain, vomiting, diarrhea, bleeding)       *No Answer* 11. PREGNANCY: "Is there any chance you are pregnant?" "When was your last menstrual period?"       *No Answer*  Protocols used: DIZZINESS [employer]

## 2020-12-29 ENCOUNTER — Telehealth: Payer: Self-pay

## 2020-12-29 NOTE — Telephone Encounter (Signed)
Copied from CRM [id number]. Topic: Referral - Status >> Dec 26, 2020 12:03 PM Leafy Ro wrote: Reason for CRM:Pt does not want to go to [facility] neurologist. Pt would like to go to neurologist in [place] for numbness and pain to rule out MS

## 2020-12-29 NOTE — Telephone Encounter (Signed)
Referral has been changed as requested. 

## 2021-01-02 ENCOUNTER — Ambulatory Visit: Payer: Self-pay | Admitting: Family Medicine

## 2021-02-17 ENCOUNTER — Ambulatory Visit: Payer: Self-pay

## 2021-02-17 NOTE — Telephone Encounter (Signed)
Hip pain that is referred to both legs. Cannot stand up for long periods- 5 years worsened last 2 weeks. Difficulty sleeping. Has trouble getting comfortable to sleep.Pain is interfering with life. Pt CMS and stated that sometimes she is unable to stand so she has to sit in a chair beside the bed to change a pt's brief. Pt became tearful during call.  Pt stated her hips pop when walking or if after sitting for a while then she stands up.  Pt stated the pain is impacting her mentally. She is anxious and depressed. She is worried about her employment options and how long she will be able to work due to her health.  Attempted to find an earlier appt but none available. Will forward to office to review note.  Care advice given and pt verbalized.   Reason for Disposition . Numbness in a leg or foot (i.e., loss of sensation)    Right leg numbness, foot tingling goes away with stretching  Answer Assessment - Initial Assessment Questions 1. LOCATION and RADIATION: "Where is the pain located?"      Bilateral hip 2. QUALITY: "What does the pain feel like?"  (e.g., sharp, dull, aching, burning)     Bone on bone pain - radiates through body. Burning, sharp, numbness 3. SEVERITY: "How bad is the pain?" "What does it keep you from doing?"   (Scale 1-10; or mild, moderate, severe)   -  MILD (1-3): doesn't interfere with normal activities    -  MODERATE (4-7): interferes with normal activities (e.g., work or school) or awakens from sleep, limping    -  SEVERE (8-10): excruciating pain, unable to do any normal activities, unable to walk     moderate 4. ONSET: "When did the pain start?" "Does it come and go, or is it there all the time?"     5 years  But has worsened last 2 years 5. WORK OR EXERCISE: "Has there been any recent work or exercise that involved this part of the body?"      Walking and CNA work activity 6. CAUSE: "What do you think is causing the hip pain?"      Something rubbing together 7.  AGGRAVATING FACTORS: "What makes the hip pain worse?" (e.g., walking, climbing stairs, running)     Walking and standing 8. OTHER SYMPTOMS: "Do you have any other symptoms?" (e.g., back pain, pain shooting down leg,  fever, rash)     Referred pain down the legs.  Protocols used: HIP PAIN-A-AH

## 2021-02-17 NOTE — Telephone Encounter (Signed)
Mailbox full

## 2021-02-17 NOTE — Telephone Encounter (Signed)
appt changed to Yesenia Thomas for sooner appt

## 2021-02-20 ENCOUNTER — Ambulatory Visit (HOSPITAL_BASED_OUTPATIENT_CLINIC_OR_DEPARTMENT_OTHER): Payer: Medicaid Other | Admitting: Internal Medicine

## 2021-02-24 ENCOUNTER — Encounter: Payer: Self-pay | Admitting: Unknown Physician Specialty

## 2021-02-24 ENCOUNTER — Other Ambulatory Visit: Payer: Self-pay

## 2021-02-24 ENCOUNTER — Ambulatory Visit: Payer: Medicaid Other | Admitting: Unknown Physician Specialty

## 2021-02-24 VITALS — BP 138/82 | HR 100 | Temp 98.1°F | Resp 18 | Ht 68.0 in | Wt 364.7 lb

## 2021-02-24 DIAGNOSIS — Z111 Encounter for screening for respiratory tuberculosis: Secondary | ICD-10-CM | POA: Diagnosis not present

## 2021-02-24 DIAGNOSIS — Z6841 Body Mass Index (BMI) 40.0 and over, adult: Secondary | ICD-10-CM

## 2021-02-24 DIAGNOSIS — D509 Iron deficiency anemia, unspecified: Secondary | ICD-10-CM

## 2021-02-24 DIAGNOSIS — M25559 Pain in unspecified hip: Secondary | ICD-10-CM

## 2021-02-24 DIAGNOSIS — R42 Dizziness and giddiness: Secondary | ICD-10-CM

## 2021-02-24 MED ORDER — MELOXICAM 15 MG PO TABS: 15.0000 mg | ORAL_TABLET | Freq: Every day | ORAL | 0 refills | Status: DC

## 2021-02-24 NOTE — Assessment & Plan Note (Signed)
Lost to f/u with hematology.  Check CBC and anemia panel

## 2021-02-24 NOTE — Progress Notes (Signed)
BP 138/82   Pulse 100   Temp 98.1 F (36.7 C) (Oral)   Resp 18   Ht 5\' 8"  (1.727 m)   Wt (!) 364 lb 11.2 oz (165.4 kg)   SpO2 99%   BMI 55.45 kg/m    Subjective:    Patient ID: , female    DOB: 1980-12-28, 40 y.o.   MRN: [id number]  HPI: Yesenia Thomas is a 39 y.o. female  Chief Complaint  Patient presents with   Leg Pain    Bilateral leg pain    Pt was referred to neurology about 2 months ago due to bilateral hand numbness and tingling due to MS suspicion.  However, has been doing research and doesn't think her symptoms match.   Today she would like to talk about bilateral hip and leg pain which started about 5 years ago during delivery of twins.  Felt a "pop" after delivering 2nd child and never went back to normal.  She works as a 41 jobs.  She does find sitting makes it worse.  Lost that job recently but unable to stand and walk.  Standing and walking makes it worse.  Points to lateral hips and needs to rotate constantly during the night, but has to be careful  with how she turns.  Admits to lower back pain. Thighs hurt after standing for a period of time.  Unable to stand long enough to wash dishes.  Pt states that Tylenol makes her sleepy and would like to have something to get her through a shift.    Pt reports that her legs are swollen, and she gets up every hour to use the bathroom at night.  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  Pulse is typically in the 100s while up and doing things.    Lab review shows that 2 years ago had anemia and referred for labs.    Denies sleep apnea and was tested in 2017  Relevant past medical, surgical, family and social history reviewed and updated as indicated. Interim medical history since our last visit reviewed. Allergies and medications reviewed and updated.  Review of Systems  Cardiovascular:  Positive for leg swelling. Negative for chest pain and palpitations.        States she feels faint after standing.     Per HPI unless specifically indicated above     Objective:    BP 138/82   Pulse 100   Temp 98.1 F (36.7 C) (Oral)   Resp 18   Ht 5\' 8"  (1.727 m)   Wt (!) 364 lb 11.2 oz (165.4 kg)   SpO2 99%   BMI 55.45 kg/m   Wt Readings from Last 3 Encounters:  02/24/21 (!) 364 lb 11.2 oz (165.4 kg)  12/16/20 (!) 371 lb 4.8 oz (168.4 kg)  05/25/19 (!) 386 lb 1.6 oz (175.1 kg)    Physical Exam Constitutional:      Appearance: She is obese.  HENT:     Head: Normocephalic.  Cardiovascular:     Rate and Rhythm: Normal rate and regular rhythm.     Heart sounds: Normal heart sounds.  Pulmonary:     Breath sounds: Normal breath sounds.  Musculoskeletal:        General: No swelling or tenderness.     Comments: No LL edema noted today  Skin:    General: Skin is warm and dry.  Psychiatric:        Mood  and Affect: Mood normal.   EKG nsr.  No STTW changes.  ? Previous infarct  Results for orders placed or performed in visit on 05/25/19  COMPLETE METABOLIC PANEL WITH GFR  Result Value Ref Range   Glucose, Bld 90 65 - 99 mg/dL   BUN 9 7 - 25 mg/dL   Creat 1.24 5.80 - 9.98 mg/dL   GFR, Est Non African American 113 > OR = 60 mL/min/1.21m2   GFR, Est African American 131 > OR = 60 mL/min/1.53m2   BUN/Creatinine Ratio NOT APPLICABLE 6 - 22 (calc)   Sodium 141 135 - 146 mmol/L   Potassium 3.8 3.5 - 5.3 mmol/L   Chloride 106 98 - 110 mmol/L   CO2 27 20 - 32 mmol/L   Calcium 9.0 8.6 - 10.2 mg/dL   Total Protein 6.7 6.1 - 8.1 g/dL   Albumin 3.9 3.6 - 5.1 g/dL   Globulin 2.8 1.9 - 3.7 g/dL (calc)   AG Ratio 1.4 1.0 - 2.5 (calc)   Total Bilirubin 0.3 0.2 - 1.2 mg/dL   Alkaline phosphatase (APISO) 55 31 - 125 U/L   AST 15 10 - 30 U/L   ALT 17 6 - 29 U/L  CBC with Differential/Platelet  Result Value Ref Range   WBC 9.7 3.8 - 10.8 Thousand/uL   RBC 4.65 3.80 - 5.10 Million/uL   Hemoglobin 9.5 (L) 11.7 - 15.5 g/dL   HCT 33.8 (L) 25.0 - 53.9 %    MCV 69.5 (L) 80.0 - 100.0 fL   MCH 20.4 (L) 27.0 - 33.0 pg   MCHC 29.4 (L) 32.0 - 36.0 g/dL   RDW 76.7 (H) 34.1 - 93.7 %   Platelets 443 (H) 140 - 400 Thousand/uL   MPV 8.9 7.5 - 12.5 fL   Neutro Abs 5,704 1,500 - 7,800 cells/uL   Lymphs Abs 3,094 850 - 3,900 cells/uL   Absolute Monocytes 679 200 - 950 cells/uL   Eosinophils Absolute 194 15 - 500 cells/uL   Basophils Absolute 29 0 - 200 cells/uL   Neutrophils Relative % 58.8 %   Total Lymphocyte 31.9 %   Monocytes Relative 7.0 %   Eosinophils Relative 2.0 %   Basophils Relative 0.3 %   Smear Review    Iron, TIBC and Ferritin Panel  Result Value Ref Range   Iron 19 (L) 40 - 190 mcg/dL   TIBC 902 409 - 735 mcg/dL (calc)   %SAT 5 (L) 16 - 45 % (calc)   Ferritin 12 (L) 16 - 154 ng/mL  Hemoglobin A1c  Result Value Ref Range   Hgb A1c MFr Bld 5.6 <5.7 % of total Hgb   Mean Plasma Glucose 114 (calc)   eAG (mmol/L) 6.3 (calc)  POCT urinalysis dipstick  Result Value Ref Range   Color, UA drk yellow    Clarity, UA cloudy    Glucose, UA Negative Negative   Bilirubin, UA neg    Ketones, UA neg    Spec Grav, UA 1.015 1.010 - 1.025   Blood, UA neg    pH, UA 6.5 5.0 - 8.0   Protein, UA Negative Negative   Urobilinogen, UA 0.2 0.2 or 1.0 E.U./dL   Nitrite, UA neg    Leukocytes, UA Negative Negative   Appearance clody    Odor foul       Assessment & Plan:   Problem List Items Addressed This Visit       Unprioritized   Iron deficiency anemia    Lost  to f/u with hematology.  Check CBC and anemia panel       Relevant Orders   CBC with Differential/Platelet   Vitamin B12   Folate   Iron and TIBC   Ferritin   Severe obesity (BMI >= 40) (HCC) - Primary    Complicating orthopedic pain and cardiac strain.         Other Visit Diagnoses     Hip pain       Bilateral hip pain.  Will get x-ray.  Meloxicam for pain.  States Tylenol makes her sleepy. May referral to ortho and pain management   Relevant Orders   Comprehensive  metabolic panel   XR HIPS BILAT W OR W/O PELVIS 2V   Visit for TB skin test       Needs Tb for work.  Rection to skin test.  Check quantiferron gold   Relevant Orders   QuantiFERON-TB Gold Plus   Dizzy spells       Afrter standing.EKG without acute changes.Due to reported leg swelling (none today) frequent urination and hx of LVH with severe obesity. Refer cardiology        Follow up plan: Return in about 2 weeks (around 03/10/2021).

## 2021-02-24 NOTE — Assessment & Plan Note (Signed)
Complicating orthopedic pain and cardiac strain.

## 2021-02-25 ENCOUNTER — Ambulatory Visit
Admission: RE | Admit: 2021-02-25 | Discharge: 2021-02-25 | Disposition: A | Discharge status: Home / Self Care | Payer: Medicaid Other | Source: Ambulatory Visit | Attending: Unknown Physician Specialty | Admitting: Unknown Physician Specialty

## 2021-02-25 ENCOUNTER — Other Ambulatory Visit: Payer: Self-pay | Admitting: Unknown Physician Specialty

## 2021-02-25 ENCOUNTER — Telehealth: Payer: Self-pay | Admitting: Family Medicine

## 2021-02-25 ENCOUNTER — Telehealth: Payer: Self-pay

## 2021-02-25 ENCOUNTER — Encounter: Payer: Self-pay | Admitting: Unknown Physician Specialty

## 2021-02-25 DIAGNOSIS — M25559 Pain in unspecified hip: Secondary | ICD-10-CM | POA: Insufficient documentation

## 2021-02-25 NOTE — Telephone Encounter (Signed)
Copied from CRM [id number]. Topic: General - Inquiry >> Feb 25, 2021  2:17 PM Traci Sermon wrote: Reason for CRM: Pt called in stating if PCP can send a chest xray order into the office where she is sent to get her hip xray, and wants to go ahead and do both on same day. Please advise.

## 2021-02-25 NOTE — Telephone Encounter (Signed)
Copied from CRM [id number]. Topic: General - Other >> Feb 25, 2021  4:58 PM Marylen Ponto wrote: Reason for CRM: Tiffany with [facility] called to ask that the order placed by Gabriel Cirri for this patient be corrected. Cb# [phone]

## 2021-02-25 NOTE — Telephone Encounter (Signed)
TB results not back can take up to 3 days.  Provider will review then we will call pt with results

## 2021-02-25 NOTE — Telephone Encounter (Signed)
Patient called to get her TB blood test results.  Please call patient to discuss at [phone]

## 2021-02-27 ENCOUNTER — Encounter: Payer: Self-pay | Admitting: Unknown Physician Specialty

## 2021-02-27 LAB — CBC WITH DIFFERENTIAL/PLATELET
Absolute Monocytes: 755 cells/uL (ref 200–950)
Basophils Absolute: 20 cells/uL (ref 0–200)
Basophils Relative: 0.2 %
Eosinophils Absolute: 176 cells/uL (ref 15–500)
Eosinophils Relative: 1.8 %
HCT: 37.2 % (ref 35.0–45.0)
Hemoglobin: 10.9 g/dL — ABNORMAL LOW (ref 11.7–15.5)
Lymphs Abs: 2764 cells/uL (ref 850–3900)
MCH: 21.9 pg — ABNORMAL LOW (ref 27.0–33.0)
MCHC: 29.3 g/dL — ABNORMAL LOW (ref 32.0–36.0)
MCV: 74.7 fL — ABNORMAL LOW (ref 80.0–100.0)
MPV: 9.1 fL (ref 7.5–12.5)
Monocytes Relative: 7.7 %
Neutro Abs: 6086 cells/uL (ref 1500–7800)
Neutrophils Relative %: 62.1 %
Platelets: 386 10*3/uL (ref 140–400)
RBC: 4.98 10*6/uL (ref 3.80–5.10)
RDW: 17.2 % — ABNORMAL HIGH (ref 11.0–15.0)
Total Lymphocyte: 28.2 %
WBC: 9.8 10*3/uL (ref 3.8–10.8)

## 2021-02-27 LAB — QUANTIFERON-TB GOLD PLUS
Mitogen-NIL: >10 IU/mL
NIL: 0.04 IU/mL
QuantiFERON-TB Gold Plus: NEGATIVE
TB1-NIL: 0.01 IU/mL
TB2-NIL: 0.02 IU/mL

## 2021-02-27 LAB — FERRITIN: Ferritin: 16 ng/mL (ref 16–154)

## 2021-02-27 LAB — COMPREHENSIVE METABOLIC PANEL
AG Ratio: 1.1 (calc) (ref 1.0–2.5)
ALT: 12 U/L (ref 6–29)
AST: 15 U/L (ref 10–30)
Albumin: 3.8 g/dL (ref 3.6–5.1)
Alkaline phosphatase (APISO): 49 U/L (ref 31–125)
BUN: 10 mg/dL (ref 7–25)
CO2: 26 mmol/L (ref 20–32)
Calcium: 9.5 mg/dL (ref 8.6–10.2)
Chloride: 106 mmol/L (ref 98–110)
Creat: 0.74 mg/dL (ref 0.50–1.10)
Globulin: 3.4 g/dL (calc) (ref 1.9–3.7)
Glucose, Bld: 83 mg/dL (ref 65–99)
Potassium: 4.1 mmol/L (ref 3.5–5.3)
Sodium: 141 mmol/L (ref 135–146)
Total Bilirubin: 0.4 mg/dL (ref 0.2–1.2)
Total Protein: 7.2 g/dL (ref 6.1–8.1)

## 2021-02-27 LAB — IRON, TOTAL/TOTAL IRON BINDING CAP
%SAT: 5 % (calc) — ABNORMAL LOW (ref 16–45)
Iron: 19 ug/dL — ABNORMAL LOW (ref 40–190)
TIBC: 354 mcg/dL (calc) (ref 250–450)

## 2021-02-27 LAB — FOLATE: Folate: 13.9 ng/mL

## 2021-02-27 LAB — VITAMIN B12: Vitamin B-12: 355 pg/mL (ref 200–1100)

## 2021-03-02 ENCOUNTER — Telehealth: Payer: Medicaid Other | Admitting: Family Medicine

## 2021-03-12 ENCOUNTER — Ambulatory Visit: Payer: Medicaid Other | Admitting: Family Medicine

## 2021-03-17 ENCOUNTER — Ambulatory Visit: Payer: Medicaid Other | Admitting: Family Medicine

## 2021-03-17 NOTE — Progress Notes (Deleted)
    SUBJECTIVE:   CHIEF COMPLAINT / HPI:   HIP PAIN - seen previously 6/14 for same, ongoing for 5+ years after birth of twins. Duration: chronic Involved hip: {Blank single:19197::"left","right","bilateral"}  Mechanism of injury: {Blank single:19197::"trauma","unknown"} Location: {Blank single:19197::"anterior","posterior","lateral","medial","diffuse"} Onset: {Blank single:19197::"sudden","gradual"}  Severity: {Blank single:19197::"mild","moderate","severe","1/10","2/10","3/10","4/10","5/10","6/10","7/10","8/10","9/10","10/10"}  Quality: {Blank multiple:19196::"sharp","dull","aching","burning","cramping","ill-defined","itchy","pressure-like","pulling","shooting","sore","stabbing","tender","tearing","throbbing"} Frequency: {Blank single:19197::"constant","intermittent","occasional","rare","every few minutes","a few times a hour","a few times a day","a few times a week","a few times a month","a few times a year"} Radiation: {Blank single:19197::"yes","no"} Aggravating factors: {Blank multiple:19196::"weight bearing","walking","running","stairs","bending","movement","prolonged sitting"}   Alleviating factors: {Blank multiple:19196::"nothing","ice","physical therapy","HEP","APAP","NSAIDs","crutches","rest"}  Status: {Blank multiple:19196::"better","worse","stable","fluctuating"} Treatments attempted: {Blank multiple:19196::"none","rest","ice","heat","APAP","ibuprofen","aleve","physical therapy","HEP","chiropractor"}   Relief with NSAIDs?: {Blank single:19197::"No NSAIDs Taken","no","mild","moderate","significant"} Weakness with weight bearing: {Blank single:19197::"yes","no"} Weakness with walking: {Blank single:19197::"yes","no"} Paresthesias / decreased sensation: {Blank single:19197::"yes","no"} Swelling: {Blank single:19197::"yes","no"} Redness:{Blank single:19197::"yes","no"} Fevers: {Blank single:19197::"yes","no"}   OBJECTIVE:   There were no vitals taken for this visit.   ***  ASSESSMENT/PLAN:   No problem-specific Assessment & Plan notes found for this encounter.     Caro Laroche, DO [facility]   {    This will disappear when note is signed, click to select method of visit    :1}

## 2021-08-25 NOTE — Progress Notes (Signed)
Name: Yesenia Thomas   MRN: [id number]    DOB: [birth year]   Date:08/26/2021       Progress Note  Subjective  Chief Complaint  Standing/ Mobility Concerns  HPI  Patient came in today, first time seeing her, with what seems to be recurrent complaints of back pain, leg pain and also dizzy spells.   Symptoms started about 5 years ago when she was pregnant with her last child . She states during that pregnancy she was feeling sweaty and having dizzy spells when standing for a prolonged period of time. She was sent to cardiologist and echo showed left atrium enlargement and mitral valve calcification. She has not seen a cardiologist since . She is very concerned about symptoms because it is getting progressively worse. I asked her if associated with back pain and she is not sure. Denies vertigo, feels like she will faint. It is associated with nausea but no vomiting. No syncopal episodes. It resolves when she sits down and rests  Back pain: she has been pregnant multiple times. She has a total of 9 children , two sets of twins. She states pain is worse when standing still, also has pain on outer hips and when laying down on the same position or sitting for a prolonged period of time her legs go numb, worse on the right side, but also present on the left side, denies bowel or bladder incontinence  Morbid obesity: explained some of her symptoms may improve with weight loss. Discussed portion control and avoiding junk food  Dysthymia: she is very worried and stressed. 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 She refuses medication.   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 No redness or increase in warmth  Iron deficiency anemia: she denies heavy cycles, states diagnosed with h. Pylori many years ago but never took the  medication, she has regurgitation, intermittent epigastric pain. Denies blood in stools.   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   GERD (gastroesophageal reflux disease) 04/17/2013    Past Surgical History:  Procedure Laterality Date   CESAREAN SECTION      Family History  Problem Relation Age of Onset   Hypertension Mother    Thrombosis Maternal Grandmother    Clotting disorder Maternal Grandfather    Deep vein thrombosis Maternal Grandfather     Social History   Tobacco Use   Smoking status: Never   Smokeless tobacco: Never  Substance Use Topics   Alcohol use: Not Currently     Current Outpatient Medications:    Acetaminophen 500 MG coapsule, Take by mouth., Disp: , Rfl:    famotidine (PEPCID) 20 MG tablet, Take 20 mg by mouth 2 (two) times daily., Disp: , Rfl:    ibuprofen (ADVIL) 200 MG tablet, Take 200 mg by mouth every 6 (six) hours as needed., Disp: , Rfl:   Allergies  Allergen  Reactions   Lactose Intolerance (Gi)    Percocet [Oxycodone-Acetaminophen]     I personally reviewed active problem list, medication list, allergies, family history, social history, health maintenance with the patient/caregiver today.   ROS  Constitutional: Negative for fever or weight change.  Respiratory: Negative for cough and shortness of breath.   Cardiovascular: Negative for chest pain or palpitations.  Gastrointestinal: Negative for abdominal pain, no bowel changes.  Musculoskeletal: Negative for gait problem or joint swelling.  Skin: Negative for rash.   Neurological: positive  for dizziness but no headache.  No other specific complaints in a complete review of systems (except as listed in HPI above).   Objective  Vitals:   08/26/21 0955  BP: 134/78  Pulse: 92  Resp: 18  Temp: (!) 97.5 F (36.4 C)  TempSrc: Oral  SpO2: 98%  Weight: (!) 363 lb 1.6 oz (164.7 kg)  Height: 5\' 6"  (1.676 m)    Body mass index is 58.61 kg/m.  Physical Exam  Constitutional: Patient appears well-developed and well-nourished. Obese  No distress.  HEENT: head atraumatic, normocephalic, pupils equal and reactive to light,  neck supple Cardiovascular: Normal rate, regular rhythm and normal heart sounds.  No murmur heard. No BLE edema. Pulmonary/Chest: Effort normal and breath sounds normal. No respiratory distress. Abdominal: Soft.  There is no tenderness.pendulous abdomen  Muscular skeletal: no pain during palpation of lumbar spine, negative straight leg raise, normal hip exam.  Psychiatric: Patient has a normal mood and affect. behavior is normal. Judgment and thought content normal.   PHQ2/9: Depression screen [facility] 2/9 08/26/2021 02/24/2021 12/16/2020 05/25/2019 12/08/2018  Decreased Interest 2 1 3  0 0  Down, Depressed, Hopeless 2 1 2  0 0  PHQ - 2 Score 4 2 5  0 0  Altered sleeping 3 0 3 0 0  Tired, decreased energy 3 1 3  0 0  Change in appetite 0 0 0 0 0  Feeling bad or failure about yourself  2 0 2 0 0  Trouble concentrating 3 0 2 0 0  Moving slowly or fidgety/restless 0 0 1 0 0  Suicidal thoughts 0 0 0 0 0  PHQ-9 Score 15 3 16  0 0  Difficult doing work/chores Very difficult Not difficult at all Not difficult at all Not difficult at all Not difficult at all    phq 9 is positive   Fall Risk: Fall Risk  08/26/2021 02/24/2021 12/16/2020 05/25/2019 12/08/2018  Falls in the past year? 0 1 0 0 0  Number falls in past yr: - 1 0 0 0  Injury with Fall? - 0 0 0 0  Risk for fall due to : - History of fall(s);Impaired balance/gait - - -  Follow up Falls  prevention discussed Falls evaluation completed - - Falls evaluation completed    Assessment & Plan  1. Dizzy spells  - Ambulatory referral to Cardiology Explained it may be secondary to pain, she does not want medications    Explained possible panic attacks   2. Iron deficiency anemia, unspecified iron deficiency anemia type  - Ambulatory referral to Gastroenterology  3. Morbid obesity (HCC)  Discussed with the patient the risk posed by an increased BMI. Discussed importance of portion control, calorie counting and at least 150 minutes of physical activity weekly. Avoid sweet beverages and drink more water. Eat at least 6 servings of fruit and vegetables daily    4. 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 Left ventricular hypertrophy  - Ambulatory referral to Cardiology  6. Right hand pain  - Ambulatory referral to Orthopedic Surgery  7. Dysthymia   8. GERD without esophagitis

## 2021-08-26 ENCOUNTER — Ambulatory Visit (INDEPENDENT_AMBULATORY_CARE_PROVIDER_SITE_OTHER): Payer: Medicaid Other | Admitting: Family Medicine

## 2021-08-26 ENCOUNTER — Encounter: Payer: Self-pay | Admitting: Family Medicine

## 2021-08-26 VITALS — BP 134/78 | HR 92 | Temp 97.5°F | Resp 18 | Ht 66.0 in | Wt 363.1 lb

## 2021-08-26 DIAGNOSIS — M5416 Radiculopathy, lumbar region: Secondary | ICD-10-CM

## 2021-08-26 DIAGNOSIS — K219 Gastro-esophageal reflux disease without esophagitis: Secondary | ICD-10-CM

## 2021-08-26 DIAGNOSIS — M79641 Pain in right hand: Secondary | ICD-10-CM

## 2021-08-26 DIAGNOSIS — F341 Dysthymic disorder: Secondary | ICD-10-CM

## 2021-08-26 DIAGNOSIS — D509 Iron deficiency anemia, unspecified: Secondary | ICD-10-CM | POA: Diagnosis not present

## 2021-08-26 DIAGNOSIS — I517 Cardiomegaly: Secondary | ICD-10-CM

## 2021-08-26 DIAGNOSIS — R42 Dizziness and giddiness: Secondary | ICD-10-CM

## 2021-09-11 ENCOUNTER — Encounter: Payer: Self-pay | Admitting: Cardiology

## 2021-09-11 ENCOUNTER — Ambulatory Visit (INDEPENDENT_AMBULATORY_CARE_PROVIDER_SITE_OTHER): Payer: Medicaid Other | Admitting: Cardiology

## 2021-09-11 ENCOUNTER — Other Ambulatory Visit: Payer: Self-pay

## 2021-09-11 VITALS — BP 125/83 | HR 81 | Ht 66.0 in | Wt 368.0 lb

## 2021-09-11 DIAGNOSIS — R931 Abnormal findings on diagnostic imaging of heart and coronary circulation: Secondary | ICD-10-CM | POA: Diagnosis not present

## 2021-09-11 DIAGNOSIS — R55 Syncope and collapse: Secondary | ICD-10-CM | POA: Diagnosis not present

## 2021-09-11 NOTE — Progress Notes (Signed)
Cardiology Office Note:    Date:  09/11/2021   ID:  Yesenia Thomas, DOB 09-25-1980, MRN [id number]  PCP:  Danelle Berry, PA-C   [facility] Providers Cardiologist:  None     Referring MD: Alba Cory, MD   Chief Complaint  Patient presents with   NEW patient-dizziness/LVH    History of Present Illness:    Yesenia Thomas is a 40 y.o. female with a hx of obesity who presents due to dizziness.  Patient states having symptoms of dizziness ongoing over the past several weeks.  Symptoms usually occur when she stands up for extended periods of time.  She has symptoms of tingling, lightheadedness, dizziness and presyncope.  She usually bends over or sits down and let symptoms pass.  She denies any symptoms if she is walking.  Past Medical History:  Diagnosis Date   ADHD    Anxiety    Chlamydia    Depression    GERD (gastroesophageal reflux disease)    Iron deficiency anemia    Morbid obesity due to excess calories (HCC)    PPD positive    Pregnancy induced hypertension    Trauma    Varicella     Past Surgical History:  Procedure Laterality Date   CESAREAN SECTION      Current Medications: Current Meds  Medication Sig   Acetaminophen 500 MG coapsule Take by mouth as needed.   famotidine (PEPCID) 20 MG tablet Take 20 mg by mouth 2 (two) times daily as needed for heartburn or indigestion.   ibuprofen (ADVIL) 200 MG tablet Take 200 mg by mouth every 6 (six) hours as needed.     Allergies:   Lactose intolerance (gi) and Percocet [oxycodone-acetaminophen]   Social History   Socioeconomic History   Marital status: Unknown    Spouse name: Not on file   Number of children: Not on file   Years of education: Not on file   Highest education level: Not on file  Occupational History   Not on file  Tobacco Use   Smoking status: Never   Smokeless tobacco: Never  Vaping Use   Vaping Use: Never used  Substance and Sexual Activity   Alcohol use: Not  Currently   Drug use: Not Currently    Types: Marijuana   Sexual activity: Yes  Other Topics Concern   Not on file  Social History Narrative   Not on file   Social Determinants of Health   Financial Resource Strain: Not on file  Food Insecurity: Not on file  Transportation Needs: Not on file  Physical Activity: Not on file  Stress: Not on file  Social Connections: Not on file     Family History: The patient's family history includes Clotting disorder in her maternal grandfather; Deep vein thrombosis in her maternal grandfather; Heart attack in her paternal grandmother; Hypertension in her mother; Thrombosis in her maternal grandmother.  ROS:   Please see the history of present illness.     All other systems reviewed and are negative.  EKGs/Labs/Other Studies Reviewed:    The following studies were reviewed today:   EKG:  EKG is  ordered today.  The ekg ordered today demonstrates normal sinus rhythm, normal ECG  Recent Labs: 02/24/2021: ALT 12; BUN 10; Creat 0.74; Hemoglobin 10.9; Platelets 386; Potassium 4.1; Sodium 141  Recent Lipid Panel No results found for: CHOL, TRIG, HDL, CHOLHDL, VLDL, LDLCALC, LDLDIRECT   Risk Assessment/Calculations:          Physical  Exam:    VS:  BP 125/83 (BP Location: Right Arm, Patient Position: Sitting, Cuff Size: Large)    Pulse 81    Ht 5\' 6"  (1.676 m)    Wt (!) 368 lb (166.9 kg)    SpO2 98%    BMI 59.40 kg/m     Wt Readings from Last 3 Encounters:  09/11/21 (!) 368 lb (166.9 kg)  08/26/21 (!) 363 lb 1.6 oz (164.7 kg)  02/24/21 (!) 364 lb 11.2 oz (165.4 kg)     GEN:  Well nourished, well developed in no acute distress HEENT: Normal NECK: No JVD; No carotid bruits LYMPHATICS: No lymphadenopathy CARDIAC: RRR, no murmurs, rubs, gallops RESPIRATORY:  Clear to auscultation without rales, wheezing or rhonchi  ABDOMEN: Soft, non-tender, non-distended MUSCULOSKELETAL:  No edema; No deformity  SKIN: Warm and dry NEUROLOGIC:   Alert and oriented x 3 PSYCHIATRIC:  Normal affect   ASSESSMENT:    1. Vasovagal near syncope   2. Abnormal echocardiogram   3. Morbid obesity (HCC)    PLAN:    In order of problems listed above:  History of dizziness, presyncope associated with prodromes of diaphoresis, lightheadedness.  Denies passing out.  Symptoms consistent with vasovagal incidents.  Not consistent with cardiac etiology.  Orthostatic vitals with no evidence of orthostasis. History of mitral valve calcification, LVH and dilated left atrium on prior echocardiogram.  Repeat echo to evaluate or confirm any structural abnormalities.  EKG reveals normal LVH noted. Morbid obesity, low-calorie diet, weight loss recommended.  Follow-up if significant abnormalities noted on echo.  Follow-up as needed if echo is otherwise normal.   Medication Adjustments/Labs and Tests Ordered: Current medicines are reviewed at length with the patient today.  Concerns regarding medicines are outlined above.  Orders Placed This Encounter  Procedures   EKG 12-Lead   ECHOCARDIOGRAM COMPLETE   No orders of the defined types were placed in this encounter.   Patient Instructions  Medication Instructions:  Your physician recommends that you continue on your current medications as directed. Please refer to the Current Medication list given to you today.   *If you need a refill on your cardiac medications before your next appointment, please call your pharmacy*   Lab Work: None ordered  If you have labs (blood work) drawn today and your tests are completely normal, you will receive your results only by: MyChart Message (if you have MyChart) OR A paper copy in the mail If you have any lab test that is abnormal or we need to change your treatment, we will call you to review the results.   Testing/Procedures: Echocardiogram - Your physician has requested that you have an echocardiogram.   Echocardiography is a painless test that uses  sound waves to create images of your heart. It provides your doctor with information about the size and shape of your heart and how well your hearts chambers and valves are working. This procedure takes approximately one hour. There are no restrictions for this procedure.   Follow-Up: At [facility], you and your health needs are our priority.  As part of our continuing mission to provide you with exceptional heart care, we have created designated Provider Care Teams.  These Care Teams include your primary Cardiologist (physician) and Advanced Practice Providers (APPs -  Physician Assistants and Nurse Practitioners) who all work together to provide you with the care you need, when you need it.  We recommend signing up for the patient portal called "MyChart".  Sign up information  is provided on this After Visit Summary.  MyChart is used to connect with patients for Virtual Visits (Telemedicine).  Patients are able to view lab/test results, encounter notes, upcoming appointments, etc.  Non-urgent messages can be sent to your provider as well.   To learn more about what you can do with MyChart, go to [email or web address].    Your next appointment:   As needed based on results of echo  The format for your next appointment:   In Person  Provider:   You may see Debbe Odea, MD or one of the following Advanced Practice Providers on your designated Care Team:   Nicolasa Ducking, NP Eula Listen, PA-C Cadence Fransico Michael, PA-C   :1}    Other Instructions N/A    Signed, Debbe Odea, MD  09/11/2021 4:58 PM    [facility]

## 2021-09-11 NOTE — Patient Instructions (Signed)
Medication Instructions:  Your physician recommends that you continue on your current medications as directed. Please refer to the Current Medication list given to you today.   *If you need a refill on your cardiac medications before your next appointment, please call your pharmacy*   Lab Work: None ordered  If you have labs (blood work) drawn today and your tests are completely normal, you will receive your results only by: MyChart Message (if you have MyChart) OR A paper copy in the mail If you have any lab test that is abnormal or we need to change your treatment, we will call you to review the results.   Testing/Procedures: Echocardiogram - Your physician has requested that you have an echocardiogram.   Echocardiography is a painless test that uses sound waves to create images of your heart. It provides your doctor with information about the size and shape of your heart and how well your hearts chambers and valves are working. This procedure takes approximately one hour. There are no restrictions for this procedure.   Follow-Up: At [facility], you and your health needs are our priority.  As part of our continuing mission to provide you with exceptional heart care, we have created designated Provider Care Teams.  These Care Teams include your primary Cardiologist (physician) and Advanced Practice Providers (APPs -  Physician Assistants and Nurse Practitioners) who all work together to provide you with the care you need, when you need it.  We recommend signing up for the patient portal called "MyChart".  Sign up information is provided on this After Visit Summary.  MyChart is used to connect with patients for Virtual Visits (Telemedicine).  Patients are able to view lab/test results, encounter notes, upcoming appointments, etc.  Non-urgent messages can be sent to your provider as well.   To learn more about what you can do with MyChart, go to [email or web address].    Your next  appointment:   As needed based on results of echo  The format for your next appointment:   In Person  Provider:   You may see Debbe Odea, MD or one of the following Advanced Practice Providers on your designated Care Team:   Nicolasa Ducking, NP Eula Listen, PA-C Cadence Fransico Michael, PA-C   :1}    Other Instructions N/A

## 2021-09-15 ENCOUNTER — Ambulatory Visit: Payer: Medicaid Other | Admitting: Unknown Physician Specialty

## 2021-09-16 ENCOUNTER — Ambulatory Visit
Admission: RE | Admit: 2021-09-16 | Discharge: 2021-09-16 | Disposition: A | Discharge status: Home / Self Care | Payer: Medicaid Other | Source: Ambulatory Visit | Attending: Internal Medicine | Admitting: Internal Medicine

## 2021-09-16 ENCOUNTER — Telehealth: Payer: Self-pay

## 2021-09-16 ENCOUNTER — Ambulatory Visit
Admission: RE | Admit: 2021-09-16 | Discharge: 2021-09-16 | Disposition: A | Discharge status: Home / Self Care | Payer: Medicaid Other | Attending: Internal Medicine | Admitting: Internal Medicine

## 2021-09-16 ENCOUNTER — Other Ambulatory Visit: Payer: Self-pay

## 2021-09-16 ENCOUNTER — Encounter: Payer: Self-pay | Admitting: Internal Medicine

## 2021-09-16 ENCOUNTER — Ambulatory Visit (INDEPENDENT_AMBULATORY_CARE_PROVIDER_SITE_OTHER): Payer: Medicaid Other | Admitting: Internal Medicine

## 2021-09-16 VITALS — BP 118/78 | HR 98 | Temp 98.2°F | Resp 16 | Ht 66.0 in | Wt 372.4 lb

## 2021-09-16 DIAGNOSIS — M79641 Pain in right hand: Secondary | ICD-10-CM | POA: Diagnosis not present

## 2021-09-16 DIAGNOSIS — D509 Iron deficiency anemia, unspecified: Secondary | ICD-10-CM

## 2021-09-16 DIAGNOSIS — R42 Dizziness and giddiness: Secondary | ICD-10-CM | POA: Diagnosis not present

## 2021-09-16 DIAGNOSIS — R55 Syncope and collapse: Secondary | ICD-10-CM | POA: Diagnosis not present

## 2021-09-16 DIAGNOSIS — R3589 Other polyuria: Secondary | ICD-10-CM

## 2021-09-16 NOTE — Telephone Encounter (Signed)
Pt states she spoke to you about it and wants to see about getting rx for shower chair?  If its ok I will send DME order

## 2021-09-16 NOTE — Patient Instructions (Addendum)
It was great seeing you today!  [facility] : [phone]  Plan discussed at today's visit: -Blood work ordered today, results will be uploaded to MyChart.  -Continue to stay well hydrated in the meantime  -Follow up with Cardiology for echo  Follow up in: 1 month   Take care and let us know if you have any questions or concerns prior to your next visit.  Dr. Caralee Ates  Syncope, Adult Syncope refers to a condition in which a person temporarily loses consciousness. Syncope may also be called fainting or passing out. It is caused by a sudden decrease in blood flow to the brain. This can happen for a variety of reasons. Most causes of syncope are not dangerous. It can be triggered by things such as needle sticks, seeing blood, pain, or intense emotion. However, syncope can also be a sign of a serious medical problem, such as a heart abnormality. Other causes can include dehydration, migraines, or taking medicines that lower blood pressure. Your health care provider may do tests to find the reason why you are having syncope. If you faint, get medical help right away. Call your local emergency services (911 in the [place] Follow these instructions at home: Pay attention to any changes in your symptoms. Take these actions to stay safe and to help relieve your symptoms: Knowing when you may be about to faint Signs that you may be about to faint include: Feeling dizzy, weak, light-headed, or like the room is spinning. Feeling nauseous. Seeing spots or seeing all white or all black in your field of vision. Having cold, clammy skin or feeling warm and sweaty. Hearing ringing in the ears (tinnitus). If you start to feel like you might faint, sit or lie down right away. If sitting, put your head down between your legs. If lying down, raise (elevate) your feet above the level of your heart. Breathe deeply and steadily. Wait until all the symptoms have passed. Have someone stay with you until you feel  stable. Medicines Take over-the-counter and prescription medicines only as told by your health care provider. If you are taking blood pressure or heart medicine, get up slowly and take several minutes to sit and then stand. This can reduce dizziness and decrease the risk of syncope. Lifestyle Do not drive, use machinery, or play sports until your health care provider says it is okay. Do not drink alcohol. Do not use any products that contain nicotine or tobacco. These products include cigarettes, chewing tobacco, and vaping devices, such as e-cigarettes. If you need help quitting, ask your health care provider. Avoid hot tubs and saunas. General instructions Talk with your health care provider about your symptoms. You may need to have testing to understand the cause of your syncope. Drink enough fluid to keep your urine pale yellow. Avoid prolonged standing. If you must stand for a long time, do movements such as: Moving your legs. Crossing your legs. Flexing and stretching your leg muscles. Squatting. Keep all follow-up visits. This is important. Contact a health care provider if: You have episodes of near fainting. Get help right away if: You faint. You hit your head or are injured after fainting. You have any of these symptoms that may indicate trouble with your heart: Fast or irregular heartbeats (palpitations). Unusual pain in your chest, abdomen, or back. Shortness of breath. You have a seizure. You have a severe headache. You are confused. You have vision problems. You have severe weakness or trouble walking. You are bleeding from  your mouth or rectum, or you have black or tarry stool. These symptoms may represent a serious problem that is an emergency. Do not wait to see if your symptoms will go away. Get medical help right away. Call your local emergency services (911 in the [place] Do not drive yourself to the hospital. Summary Syncope refers to a condition in which a  person temporarily loses consciousness. Syncope may also be called fainting or passing out. It is caused by a sudden decrease in blood flow to the brain. Signs that you may be about to faint include dizziness, feeling light-headed, feeling nauseous, sudden vision changes, or cold, clammy skin. Even though most causes of syncope are not dangerous, syncope can be a sign of a serious medical problem. Get help right away if you faint. If you start to feel like you might faint, sit or lie down right away. If sitting, put your head down between your legs. If lying down, raise (elevate) your feet above the level of your heart. This information is not intended to replace advice given to you by your health care provider. Make sure you discuss any questions you have with your health care provider. Document Revised: 01/08/2021 Document Reviewed: 01/08/2021 Elsevier Patient Education  2022 [employer].

## 2021-09-16 NOTE — Progress Notes (Signed)
Acute Office Visit  Subjective:    Patient ID: Yesenia Thomas, female    DOB: 1980-10-01, 41 y.o.   MRN: [id number]  Chief Complaint  Patient presents with   Dizziness    W/ chest pain saw cardio Friday, they do not think its heart related more vaso, but is scheduled for echo   Follow-up    HPI Patient is in today for dizziness. Seen originally on 12/14.  Saw Cardiology for the same symptoms on 09/11/21, planning on echocardiogram but described a vasovagal etiology.Happening for 4 years, started when pregnant but getting worse. Standing for a long time make it worst. Aural fullness on and off in right ear.   Polyuria, itching when out of shower   DIZZINESS Duration:  4 years Description of symptoms:  like will pass out Duration of episode: >10 minutes, if can't sit down Dizziness frequency: recurrent Provoking factors:  prolonged standing  . Has to sit while cooking, no issues with walking and sitting  Triggered by rolling over in bed: no Triggered by bending over: no Aggravated by head movement: no Aggravated by exertion, coughing, loud noises:  yes sometimes when straining to have a BM Recent head injury: no Recent or current viral symptoms: no History of vasovagal episodes: yes Nausea: yes Vomiting: no Tinnitus: no Hearing loss: no Aural fullness: yes Headache: no Photophobia/phonophobia: no Unsteady gait: yes Postural instability: no Diplopia, dysarthria, dysphagia or weakness: no Related to exertion: no Pallor: no Diaphoresis: yes Dyspnea: yes Chest pain: no  Also complaining of itching when she gets out of the shower and polyuria. No other urinary symptoms.   Also complaining of right hand pain and joint laxity after hitting her hand forcefully a few weeks ago, would like an x-ray ofor this. No weakness, decreased grip strength or numbness/tingling.   Past Medical History:  Diagnosis Date   ADHD    Anxiety    Chlamydia    Depression    GERD  (gastroesophageal reflux disease)    Iron deficiency anemia    Morbid obesity due to excess calories (HCC)    PPD positive    Pregnancy induced hypertension    Trauma    Varicella     Past Surgical History:  Procedure Laterality Date   CESAREAN SECTION      Family History  Problem Relation Age of Onset   Hypertension Mother    Thrombosis Maternal Grandmother    Clotting disorder Maternal Grandfather    Deep vein thrombosis Maternal Grandfather    Heart attack Paternal Grandmother     Social History   Socioeconomic History   Marital status: Unknown    Spouse name: Not on file   Number of children: Not on file   Years of education: Not on file   Highest education level: Not on file  Occupational History   Not on file  Tobacco Use   Smoking status: Never   Smokeless tobacco: Never  Vaping Use   Vaping Use: Never used  Substance and Sexual Activity   Alcohol use: Not Currently   Drug use: Not Currently    Types: Marijuana   Sexual activity: Yes  Other Topics Concern   Not on file  Social History Narrative   Not on file   Social Determinants of Health   Financial Resource Strain: Not on file  Food Insecurity: Not on file  Transportation Needs: Not on file  Physical Activity: Not on file  Stress: Not on file  Social Connections: Not  on file  Intimate Partner Violence: Not on file    Outpatient Medications Prior to Visit  Medication Sig Dispense Refill   Acetaminophen 500 MG coapsule Take by mouth as needed.     famotidine (PEPCID) 20 MG tablet Take 20 mg by mouth 2 (two) times daily as needed for heartburn or indigestion.     ibuprofen (ADVIL) 200 MG tablet Take 200 mg by mouth every 6 (six) hours as needed.     No facility-administered medications prior to visit.    Allergies  Allergen Reactions   Lactose Intolerance (Gi)    Percocet [Oxycodone-Acetaminophen]     Review of Systems  Constitutional:  Positive for diaphoresis and fatigue. Negative  for chills and fever.  HENT:  Negative for congestion, ear discharge, ear pain, hearing loss, sinus pressure, sinus pain and tinnitus.   Respiratory:  Negative for cough and shortness of breath.   Cardiovascular:  Negative for chest pain and palpitations.  Gastrointestinal:  Negative for abdominal pain.  Endocrine: Positive for polyuria.  Neurological:  Positive for dizziness and light-headedness. Negative for syncope.      Objective:    Physical Exam Constitutional:      Appearance: Normal appearance. She is obese.  HENT:     Head: Normocephalic and atraumatic.     Right Ear: Tympanic membrane, ear canal and external ear normal.     Left Ear: Tympanic membrane, ear canal and external ear normal.     Nose: Nose normal.     Mouth/Throat:     Mouth: Mucous membranes are moist.     Pharynx: Oropharynx is clear.  Eyes:     Conjunctiva/sclera: Conjunctivae normal.  Cardiovascular:     Rate and Rhythm: Normal rate and regular rhythm.  Pulmonary:     Effort: Pulmonary effort is normal.     Breath sounds: Normal breath sounds.  Musculoskeletal:        General: Tenderness present. No swelling. Normal range of motion.     Right lower leg: No edema.     Left lower leg: No edema.  Skin:    General: Skin is warm and dry.  Neurological:     General: No focal deficit present.     Mental Status: She is alert. Mental status is at baseline.  Psychiatric:        Mood and Affect: Mood normal.        Behavior: Behavior normal.    BP 118/78    Pulse 98    Temp 98.2 F (36.8 C)    Resp 16    Ht 5\' 6"  (1.676 m)    Wt (!) 372 lb 6.4 oz (168.9 kg)    LMP 09/13/2021    SpO2 97%    BMI 60.11 kg/m  Wt Readings from Last 3 Encounters:  09/16/21 (!) 372 lb 6.4 oz (168.9 kg)  09/11/21 (!) 368 lb (166.9 kg)  08/26/21 (!) 363 lb 1.6 oz (164.7 kg)    Health Maintenance Due  Topic Date Due   COVID-19 Vaccine (1) Never done   PAP SMEAR-Modifier  Never done    There are no preventive care  reminders to display for this patient.   No results found for: TSH Lab Results  Component Value Date   WBC 9.8 02/24/2021   HGB 10.9 (L) 02/24/2021   HCT 37.2 02/24/2021   MCV 74.7 (L) 02/24/2021   PLT 386 02/24/2021   Lab Results  Component Value Date   NA 141 02/24/2021  K 4.1 02/24/2021   CO2 26 02/24/2021   GLUCOSE 83 02/24/2021   BUN 10 02/24/2021   CREATININE 0.74 02/24/2021   BILITOT 0.4 02/24/2021   ALKPHOS 59 02/28/2018   AST 15 02/24/2021   ALT 12 02/24/2021   PROT 7.2 02/24/2021   ALBUMIN 3.7 02/28/2018   CALCIUM 9.5 02/24/2021   ANIONGAP 9 02/28/2018   No results found for: CHOL No results found for: HDL No results found for: LDLCALC No results found for: TRIG No results found for: [facility] Lab Results  Component Value Date   HGBA1C 5.6 05/25/2019       Assessment & Plan:   1. Postural dizziness with presyncope: Cardiology planning on doing an echo but believes this is vasovagal. Will obtain some blood work to rule out diabetes, metabolic causes or anemia.   - COMPLETE METABOLIC PANEL WITH GFR - CBC w/Diff/Platelet - TSH - HgB A1c  2. Right hand pain: Physical exam normal, x-ray ordered with patient insistence.   - DG Hand Complete Right; Future   Teodora Medici, DO

## 2021-09-17 NOTE — Addendum Note (Signed)
Addended by: Teodora Medici on: 09/17/2021 09:33 AM   Modules accepted: Orders

## 2021-09-18 LAB — HEMOGLOBIN A1C
Hgb A1c MFr Bld: 5.6 % of total Hgb (ref <5.7)
Mean Plasma Glucose: 114 mg/dL
eAG (mmol/L): 6.3 mmol/L

## 2021-09-18 LAB — COMPLETE METABOLIC PANEL WITH GFR
AG Ratio: 1.4 (calc) (ref 1.0–2.5)
ALT: 14 U/L (ref 6–29)
AST: 14 U/L (ref 10–30)
Albumin: 3.2 g/dL — ABNORMAL LOW (ref 3.6–5.1)
Alkaline phosphatase (APISO): 42 U/L (ref 31–125)
BUN: 9 mg/dL (ref 7–25)
CO2: 28 mmol/L (ref 20–32)
Calcium: 8.7 mg/dL (ref 8.6–10.2)
Chloride: 107 mmol/L (ref 98–110)
Creat: 0.74 mg/dL (ref 0.50–0.99)
Globulin: 2.3 g/dL (calc) (ref 1.9–3.7)
Glucose, Bld: 99 mg/dL (ref 65–99)
Potassium: 3.7 mmol/L (ref 3.5–5.3)
Sodium: 142 mmol/L (ref 135–146)
Total Bilirubin: 0.3 mg/dL (ref 0.2–1.2)
Total Protein: 5.5 g/dL — ABNORMAL LOW (ref 6.1–8.1)
eGFR: 105 mL/min/{1.73_m2} (ref >=60)

## 2021-09-18 LAB — TEST AUTHORIZATION 2

## 2021-09-18 LAB — IRON,TIBC AND FERRITIN PANEL
%SAT: 9 % (calc) — ABNORMAL LOW (ref 16–45)
Ferritin: 18 ng/mL (ref 16–154)
Iron: 27 ug/dL — ABNORMAL LOW (ref 40–190)
TIBC: 297 mcg/dL (calc) (ref 250–450)

## 2021-09-18 LAB — CBC WITH DIFFERENTIAL/PLATELET
Absolute Monocytes: 570 cells/uL (ref 200–950)
Basophils Absolute: 37 cells/uL (ref 0–200)
Basophils Relative: 0.4 %
Eosinophils Absolute: 202 cells/uL (ref 15–500)
Eosinophils Relative: 2.2 %
HCT: 35.9 % (ref 35.0–45.0)
Hemoglobin: 11 g/dL — ABNORMAL LOW (ref 11.7–15.5)
Lymphs Abs: 3284 cells/uL (ref 850–3900)
MCH: 23.7 pg — ABNORMAL LOW (ref 27.0–33.0)
MCHC: 30.6 g/dL — ABNORMAL LOW (ref 32.0–36.0)
MCV: 77.2 fL — ABNORMAL LOW (ref 80.0–100.0)
MPV: 9.4 fL (ref 7.5–12.5)
Monocytes Relative: 6.2 %
Neutro Abs: 5106 cells/uL (ref 1500–7800)
Neutrophils Relative %: 55.5 %
Platelets: 318 10*3/uL (ref 140–400)
RBC: 4.65 10*6/uL (ref 3.80–5.10)
RDW: 17.1 % — ABNORMAL HIGH (ref 11.0–15.0)
Total Lymphocyte: 35.7 %
WBC: 9.2 10*3/uL (ref 3.8–10.8)

## 2021-09-18 LAB — TSH: TSH: 1.58 mIU/L

## 2021-09-18 MED ORDER — FERROUS GLUCONATE 324 (38 FE) MG PO TABS: 324.0000 mg | ORAL_TABLET | Freq: Every day | ORAL | 3 refills | Status: DC

## 2021-09-18 NOTE — Addendum Note (Signed)
Addended by: Margarita Mail on: 09/18/2021 08:44 AM   Modules accepted: Orders

## 2021-09-28 ENCOUNTER — Encounter: Payer: Self-pay | Admitting: Physician Assistant

## 2021-09-28 ENCOUNTER — Telehealth: Payer: Self-pay

## 2021-09-28 ENCOUNTER — Ambulatory Visit: Payer: Medicaid Other | Admitting: Physician Assistant

## 2021-09-28 ENCOUNTER — Other Ambulatory Visit: Payer: Self-pay

## 2021-09-28 VITALS — BP 124/68 | HR 96 | Temp 97.6°F | Resp 16 | Ht 66.0 in | Wt 370.8 lb

## 2021-09-28 DIAGNOSIS — R42 Dizziness and giddiness: Secondary | ICD-10-CM

## 2021-09-28 DIAGNOSIS — M5416 Radiculopathy, lumbar region: Secondary | ICD-10-CM | POA: Diagnosis not present

## 2021-09-28 MED ORDER — BLOOD GLUCOSE MONITOR KIT: PACK | 0 refills | Status: DC

## 2021-09-28 NOTE — Patient Instructions (Addendum)
It was nice to meet you and I appreciate the opportunity to be involved in your care  Please try the Epley maneuver to help with your dizziness- sometimes it can help relieve these types of symptoms.  I have placed a prescription for a rolling walker Use your shower chair to help prevent falls in the shower.   When you begin feeling dizzy, try drinking a glass of orange juice or full sugar Coca-Cola to help if your blood sugar has dropped.   For your back, hip and leg pain: I have placed a referral to Orthopedics and Physical therapy  They should contact you to schedule an apt.

## 2021-09-28 NOTE — Telephone Encounter (Signed)
We will schedule pt when she comes in for her 10/20/21 appt  Copied from [place] [phone]. Topic: General - Other >> Sep 28, 2021 12:43 PM Fields, Museum/gallery conservator R wrote: Reason for CRM: pt wants to go over her after visit summary with nurse because she needs clarification. Seeking a call back [phone]

## 2021-09-28 NOTE — Telephone Encounter (Signed)
Pt wants to see about getting referral to neurology for the dizziness

## 2021-09-28 NOTE — Progress Notes (Signed)
Acute Office Visit  Subjective:    Patient ID: Yesenia Thomas, female    DOB: [birth year], 41 y.o.   MRN: [id number]  Introduced myself to the patient as a PA-C and provided education on APPs in clinical practice.    Chief Complaint  Patient presents with   Follow-up   Dizziness   Back Pain   Hip Pain    Pt states needs a prescription for walker due to not being able to stand for long periods of time. Pt deals with a lot of back, hip pain and constant feels dizzy.    HPI Patient is in today for concerns for dizziness, back pain and hip pain.  She states she wants a walker to help with this States she is not able to stand for long periods of time due to above concerns.   Reports her back and hip pain became worse after her twins in 2017 States pain radiates from back, hip and right leg Reports she is taking Tylenol, Excedrin  Dizziness: reports sweating, dizziness, nausea, lightheadedness Reports she feels a whooshing sound in her ear, intermittently- denies pain States she typically eats only once per day States it happens more when she is standing rather than increasing with physical activity  Denies SOB with walking States bending over makes her head hurt  States she is usually alone during the day so she is afraid of falling and being on her own.    Past Medical History:  Diagnosis Date   ADHD    Anxiety    Chlamydia    Depression    GERD (gastroesophageal reflux disease)    Iron deficiency anemia    Morbid obesity due to excess calories (HCC)    PPD positive    Pregnancy induced hypertension    Trauma    Varicella     Past Surgical History:  Procedure Laterality Date   CESAREAN SECTION      Family History  Problem Relation Age of Onset   Hypertension Mother    Thrombosis Maternal Grandmother    Clotting disorder Maternal Grandfather    Deep vein thrombosis Maternal Grandfather    Heart attack Paternal Grandmother      Social History   Socioeconomic History   Marital status: Unknown    Spouse name: Not on file   Number of children: Not on file   Years of education: Not on file   Highest education level: Not on file  Occupational History   Not on file  Tobacco Use   Smoking status: Never   Smokeless tobacco: Never  Vaping Use   Vaping Use: Never used  Substance and Sexual Activity   Alcohol use: Not Currently   Drug use: Not Currently    Types: Marijuana   Sexual activity: Yes  Other Topics Concern   Not on file  Social History Narrative   Not on file   Social Determinants of Health   Financial Resource Strain: Not on file  Food Insecurity: Not on file  Transportation Needs: Not on file  Physical Activity: Not on file  Stress: Not on file  Social Connections: Not on file  Intimate Partner Violence: Not on file    Outpatient Medications Prior to Visit  Medication Sig Dispense Refill   Acetaminophen 500 MG coapsule Take by mouth as needed.     famotidine (PEPCID) 20 MG tablet Take 20 mg by mouth 2 (two) times daily as needed for heartburn or indigestion.     ferrous  gluconate (FERGON) 324 MG tablet Take 1 tablet (324 mg total) by mouth daily. 90 tablet 3   ibuprofen (ADVIL) 200 MG tablet Take 200 mg by mouth every 6 (six) hours as needed.     No facility-administered medications prior to visit.    Allergies  Allergen Reactions   Lactose Intolerance (Gi)    Percocet [Oxycodone-Acetaminophen]     Review of Systems  Musculoskeletal:  Positive for arthralgias, back pain and myalgias.  Neurological:  Positive for dizziness and light-headedness.      Objective:    Physical Exam HENT:     Right Ear: Hearing, tympanic membrane, ear canal and external ear normal.     Left Ear: Hearing, tympanic membrane, ear canal and external ear normal.  Eyes:     General: Lids are normal.     Extraocular Movements: Extraocular movements intact.     Right eye:  Nystagmus present. Normal extraocular motion.     Left eye: Normal extraocular motion and no nystagmus.     Conjunctiva/sclera: Conjunctivae normal.     Pupils: Pupils are equal, round, and reactive to light.  Cardiovascular:     Rate and Rhythm: Normal rate and regular rhythm.     Pulses: Normal pulses.     Heart sounds: Normal heart sounds.  Pulmonary:     Effort: Pulmonary effort is normal.     Breath sounds: Normal breath sounds and air entry. No decreased breath sounds, wheezing, rhonchi or rales.  Neurological:     Mental Status: She is alert and oriented to person, place, and time.     Motor: Motor function is intact.     Coordination: Coordination is intact.     Gait: Gait is intact.    BP 124/68    Pulse 96    Temp 97.6 F (36.4 C) (Oral)    Resp 16    Ht 5' 6"  (1.676 m)    Wt (!) 370 lb 12.8 oz (168.2 kg)    LMP 09/13/2021    SpO2 98%    BMI 59.85 kg/m  Wt Readings from Last 3 Encounters:  09/28/21 (!) 370 lb 12.8 oz (168.2 kg)  09/16/21 (!) 372 lb 6.4 oz (168.9 kg)  09/11/21 (!) 368 lb (166.9 kg)    Health Maintenance Due  Topic Date Due   PAP SMEAR-Modifier  Never done    There are no preventive care reminders to display for this patient.   Lab Results  Component Value Date   TSH 1.58 09/16/2021   Lab Results  Component Value Date   WBC 9.2 09/16/2021   HGB 11.0 (L) 09/16/2021   HCT 35.9 09/16/2021   MCV 77.2 (L) 09/16/2021   PLT 318 09/16/2021   Lab Results  Component Value Date   NA 142 09/16/2021   K 3.7 09/16/2021   CO2 28 09/16/2021   GLUCOSE 99 09/16/2021   BUN 9 09/16/2021   CREATININE 0.74 09/16/2021   BILITOT 0.3 09/16/2021   ALKPHOS 59 02/28/2018   AST 14 09/16/2021   ALT 14 09/16/2021   PROT 5.5 (L) 09/16/2021   ALBUMIN 3.7 02/28/2018   CALCIUM 8.7 09/16/2021   ANIONGAP 9 02/28/2018   EGFR 105 09/16/2021   No results found for: CHOL No results found for: HDL No results found for: LDLCALC No results found for: TRIG No results  found for: [facility] Lab Results  Component Value Date   HGBA1C 5.6 09/16/2021       Assessment & Plan:  Problem List Items Addressed This Visit       Nervous and Auditory   Lumbar back pain with radiculopathy affecting right lower extremity - Primary    Chronic since 2017 Referral to Orthopedics and Physical therapy for evaluation and management guidance as patient is hesitant to try pain medication Discussed pursuing weight loss with [employer] and staying active to assist with weight-related joint pain.         Relevant Orders   Ambulatory referral to Physical Therapy   Walker rolling   Ambulatory referral to Orthopedics     Other   Dizzy spells    Patient has been seen by Cardiology - believed to be experiencing Vasovagal syncope Based on symptom description today cannot rule out hypoglycemic episodes, BPPV or orthostatic hypotensive changes.  Provided patient with instructions for Epley maneuver for BPPV  Provided a blood glucose testing kit to assess for hypoglycemia  Results of these at home measures to help guide dx and management Pt states she requires rolling walker to prevent falls or issues since is alone most of the day and is worried she will fall due to dizziness Walker prescription provided today      Relevant Medications   blood glucose meter kit and supplies KIT   Other Relevant Orders   Walker rolling   Problem List Items Addressed This Visit       Nervous and Auditory   Lumbar back pain with radiculopathy affecting right lower extremity - Primary    Chronic since 2017 Referral to Orthopedics and Physical therapy for evaluation and management guidance as patient is hesitant to try pain medication Discussed pursuing weight loss with [employer] and staying active to assist with weight-related joint pain.         Relevant Orders   Ambulatory referral to Physical Therapy   Walker rolling   Ambulatory referral to Orthopedics     Other    Dizzy spells    Patient has been seen by Cardiology - believed to be experiencing Vasovagal syncope Based on symptom description today cannot rule out hypoglycemic episodes, BPPV or orthostatic hypotensive changes.  Provided patient with instructions for Epley maneuver for BPPV  Provided a blood glucose testing kit to assess for hypoglycemia  Results of these at home measures to help guide dx and management Pt states she requires rolling walker to prevent falls or issues since is alone most of the day and is worried she will fall due to dizziness Walker prescription provided today      Relevant Medications   blood glucose meter kit and supplies KIT   Other Relevant Orders   Walker rolling     Return in about 2 months (around 11/26/2021).   I, Exodus Kutzer E Peyson Delao, PA-C, have reviewed all documentation for this visit. The documentation on 09/28/21 for the exam, diagnosis, procedures, and orders are all accurate and complete.   Verlisa Vara, Glennie Isle MPH [facility]    Meds ordered this encounter  Medications   blood glucose meter kit and supplies KIT    Sig: Dispense based on patient and insurance preference. Use up to four times daily as directed.    Dispense:  1 each    Refill:  0    Order Specific Question:   Number of strips    Answer:   30    Order Specific Question:   Number of lancets    Answer:   30  Chanae Gemma E Dock Baccam, PA-C

## 2021-09-28 NOTE — Assessment & Plan Note (Addendum)
Chronic since 2017 Referral to Orthopedics and Physical therapy for evaluation and management guidance as patient is hesitant to try pain medication Discussed pursuing weight loss with [employer] and staying active to assist with weight-related joint pain.

## 2021-09-28 NOTE — Assessment & Plan Note (Addendum)
Patient has been seen by Cardiology - believed to be experiencing Vasovagal syncope Based on symptom description today cannot rule out hypoglycemic episodes, BPPV or orthostatic hypotensive changes.  Reviewed lab work for signs of electrolyte imbalance, blood glucose out of range, anemias. - overall unremarkable except for mild anemia.  Provided patient with instructions for Epley maneuver for BPPV  Provided a blood glucose testing kit to assess for hypoglycemia  Results of these at home measures to help guide dx and management Pt states she requires rolling walker to prevent falls or issues since is alone most of the day and is worried she will fall due to dizziness Walker prescription provided today

## 2021-09-28 NOTE — Telephone Encounter (Signed)
Please call and schedule an appt for CPE

## 2021-09-29 ENCOUNTER — Other Ambulatory Visit: Payer: Self-pay

## 2021-09-29 MED ORDER — GLUCOSE BLOOD VI STRP: ORAL_STRIP | 12 refills | Status: DC

## 2021-09-29 MED ORDER — ACCU-CHEK SOFTCLIX LANCETS MISC: 12 refills | Status: DC

## 2021-09-30 ENCOUNTER — Other Ambulatory Visit: Payer: Self-pay | Admitting: Physician Assistant

## 2021-09-30 ENCOUNTER — Telehealth: Payer: Self-pay

## 2021-09-30 DIAGNOSIS — R42 Dizziness and giddiness: Secondary | ICD-10-CM

## 2021-09-30 DIAGNOSIS — M5416 Radiculopathy, lumbar region: Secondary | ICD-10-CM

## 2021-09-30 NOTE — Telephone Encounter (Signed)
Called pt to inform we had put in a referral for occupational/physical therapy due to her requesting a rolling walker. We received faxed papers from [facility] and unfortunately some questions on those papers PA was not able to fill out since she needs actual evaluation through physical therapy.

## 2021-09-30 NOTE — Progress Notes (Signed)
Received paperwork concerning the prescription for the rolling walker from [facility] Paperwork requires patient's functional status to be evaluated to assess for walker suitability.   I am sending in referral to physical therapy and occupational therapy for assessment  PT and OT could also be beneficial for balance exercises and fall prevention management   Will update management strategy based on assessments from these specialities.

## 2021-10-07 ENCOUNTER — Other Ambulatory Visit: Payer: Self-pay

## 2021-10-07 ENCOUNTER — Ambulatory Visit: Payer: Medicaid Other | Admitting: Physical Therapy

## 2021-10-07 ENCOUNTER — Ambulatory Visit: Payer: Medicaid Other | Attending: Physician Assistant | Admitting: Physical Therapy

## 2021-10-07 ENCOUNTER — Encounter: Payer: Self-pay | Admitting: Physical Therapy

## 2021-10-07 DIAGNOSIS — M5416 Radiculopathy, lumbar region: Secondary | ICD-10-CM | POA: Insufficient documentation

## 2021-10-07 DIAGNOSIS — G8929 Other chronic pain: Secondary | ICD-10-CM | POA: Insufficient documentation

## 2021-10-07 DIAGNOSIS — M5441 Lumbago with sciatica, right side: Secondary | ICD-10-CM | POA: Diagnosis present

## 2021-10-07 NOTE — Therapy (Signed)
[facility] [address] [place], [address] Phone: [phone]   Fax:  [phone]  Physical Therapy Evaluation  Patient Details  Name: Yesenia Thomas MRN: [id number] Date of Birth: April 09, 1981 Referring Provider (PT): Junie Panning Mecum   Encounter Date: 10/07/2021   PT End of Session - 10/07/21 0959     Visit Number 1    Number of Visits 18    Date for PT Re-Evaluation 12/04/21    Authorization - Visit Number 1    Authorization - Number of Visits 18    PT Start Time 0815    PT Stop Time 0900    PT Time Calculation (min) 45 min    Activity Tolerance Patient tolerated treatment well    Behavior During Therapy [facility] for tasks assessed/performed             Past Medical History:  Diagnosis Date   ADHD    Anxiety    Chlamydia    Depression    GERD (gastroesophageal reflux disease)    Iron deficiency anemia    Morbid obesity due to excess calories ([place])    PPD positive    Pregnancy induced hypertension    Trauma    Varicella     Past Surgical History:  Procedure Laterality Date   CESAREAN SECTION      There were no vitals filed for this visit.    Subjective Assessment - 10/07/21 0821     Subjective --    Pertinent History 41 y.o. female pnt presents with chronic low back pain and N/T that travels down right leg with insidious onset. Pnt has been dealing with sx for approx. 5 years. Sx include "dull" pain in her low back and sacrum and "burining" pain going down her right leg. Pain on NPS at worst is 8/10, best 0/10, and currently 0/10. Aggrevating factors include standing >5 min, negotiating >1 steps, sitting for >5 min., and transfers while sleeping in bed. Pnt states these impairments have lead to decreased participation in cooking, cleaning, and walking to lose weight. Pnt currently works at home and reports pain with sitting to complete occupational duties. Easing factors include heat packs,  NSAIDs, and lumbar extension. Pnt reports pain gets worse throughout the day. PMH includes multiple pregnancies, hx of lowback pain, and a fall in 2014. 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 Pnt goals include "getting to the root of her pain" and decreasing daily pain that limits functional ADLs. Pnt denys significant weight change, B/B dysfunction, nausea/vomitting, fever, and night pain    Limitations Sitting;Standing;Walking;House hold activities;Lifting    How long can you sit comfortably? 5 min    How long can you stand comfortably? 5 min    How long can you walk comfortably? 5 min    Diagnostic tests X-ray on bilateral hips in June 2022- DJD    Patient Stated Goals "getting to the root of her pain" and decreasing daily pain that limits functional ADLs    Currently in Pain? No/denies             Posture Upper crossed syndrome  Lower crossed syndrome: in standing increased lumbar lordosis, ant pelvic tilt Bilateral knee valgus with bilat hip IR  Gait Analysis Bilateral trendelenburg Maintained lower crossed posture throughout gait, at loading phase hip IR/knee valgus/midfoot pronation Wide BOS throughout gait Decreased velocity   Lower [place] Neuro Screen Dermatomes- R:decreased sensation at L5 snd  S1, L:WNL Myotomes- WNL bilaterally  Reflexes Patellar/achilles - R:diminished reflexes 0 for each L: 2+ for each  Repeated Motions Lumbar extension: decreased back pain   Lumbar flexion: decreased back pain  Motion Active and passive hip flexion WNL Other hip motions deferred*  Lumbar Spine flexion: excessive (palms to floor) Lumbar spine extension: excessive Lumbar spine R Side bending : wnl with "stretch" pain - not concordant Lumbar spine L side bending : wnl Lumbar spine R rotation: wnl  Lumbar spine L rotaiton : wnl with *concordant pain   PAM L5/S1 WNL hypomobility due to guarding unable to assess true mobility this session, will  reassess next visit PA mob to L5 and sacrum with *concordant pain that diminishes with grade 1-2 mob  MMT deferred due to time restrictions will assess next visit (hip MMT + 5xSTS) Hip flex 5/5 bilat  Special Tests Slump Test: negative  SLR: deferred Crossed SLR: deferred FAIR: negative  SIJ provocation: Compression +, Sacral Thrust + FADIR: negative   Palpation Increased tone to lumbar extensors and mid glute max over piriformis with deep palpation-not concordant pain  STS Assessment Gower's sign, using thighs against mat for assistance, decreased velocity   Ther-Ex PT reviewed the following HEP with patient with patient able to demonstrate a set of the following with min cuing for correction needed. PT educated patient on parameters of therex (how/when to inc/decrease intensity, frequency, rep/set range, stretch hold time, and purpose of therex) with verbalized understanding.  Cat/Cow 12x  seated with education for breath control with decent carry over     [facility] PT Assessment - 10/07/21 0001       Assessment   Medical Diagnosis Lumbar rediculopathy    Referring Provider (PT) Erin Mecum    Onset Date/Surgical Date 10/07/16    Hand Dominance Right    Next MD Visit none    Prior Therapy none      Precautions   Precautions None      Restrictions   Weight Bearing Restrictions No    Other Position/Activity Restrictions no      Balance Screen   Has the patient fallen in the past 6 months No    Has the patient had a decrease in activity level because of a fear of falling?  Yes    Is the patient reluctant to leave their home because of a fear of falling?  No                        Objective measurements completed on examination: See above findings.                PT Education - 10/07/21 0959     Education Details pnt edcuated on dx, prognosis, and HEP    Person(s) Educated Patient    Methods Explanation;Demonstration;Verbal cues     Comprehension Verbalized understanding;Returned demonstration              PT Short Term Goals - 10/07/21 1023       PT SHORT TERM GOAL #1   Title Pnt will decrease max NPS from 8/10 to 6/10 to improve ability to participate in community ADLs    Baseline 10/07/21 worst NPS 8/10    Time 4    Period Weeks    Status New    Target Date 11/04/21      PT SHORT TERM GOAL #2   Title pnt will have 0/10 pain while sitting at desk for work for  at least 30 min. to complete occupational ADLs    Baseline 10/07/21 limited to 5 min    Time 4    Period Weeks    Status New    Target Date 11/04/21      PT SHORT TERM GOAL #3   Title pnt will be able to perform bed mobilty with 6/10 pain on NPS to improve quality of sleep    Baseline 10/07/21 NPS 8/10    Time 4    Period Weeks    Status New    Target Date 11/04/21               PT Long Term Goals - 10/07/21 1029       PT LONG TERM GOAL #1   Title Pt will demonstrate 5xSTS of 10sec or less to demonstrate age matched BLE norms needed for funcitonal transfers    Baseline 10/07/21 unable to complete one STS without UE use    Time 8    Period Weeks    Status New    Target Date 12/02/21      PT LONG TERM GOAL #2   Title pnt will be able to negotiate 4 steps with 0/10 pain on NPS to increase community navigation    Baseline 10/07/21 limited to 1 step with subjective increased pain    Time 8    Period Weeks    Status New    Target Date 12/02/21      PT LONG TERM GOAL #3   Title Pnt will be able to stand for 30 min with less than 4/10 pain on NPS in order to cook at home    Baseline 10/07/21 cannot stand for more than 5 min without having to rest due to pain      PT LONG TERM GOAL #4   Title Patient will increase FOTO score to 60 to demonstrate predicted increase in functional mobility to complete ADLs    Baseline 10/07/21 48    Time 8    Period Weeks    Status New                    Plan - 10/07/21 1000     Clinical  Impression Statement Pnt presents with chronic low back pain with subjective report of RLE radicular symptoms (unable to reproduce at this exam). Pnt impairments include pain, decreased sensation to L5/S1 dermatome, LE strength, and postural endurance. The listed deficits decrease pnt ability to sit, stand, transfer in bed, stair negotiation, community ambulation, squat/stoop, and lift; inhibiting participation in community and occupational ADLs (completing grocery shopping, cooking, sitting at desk while working from home, and walking to lose weight). Pnt tolerated therex well with good motivation and demonstrated good verbal understanding  as well as demonstration of HEP. Pnt requires skilled physical therapy to address listed deficiets to increase strength and improve pain in order to complete functional and occupational ADLs    Personal Factors and Comorbidities Fitness;Time since onset of injury/illness/exacerbation;Comorbidity 1    Comorbidities BMI    Examination-Activity Limitations Carry;Lift;Sit;Stand;Bed Mobility;Locomotion Level;Sleep;Bend;Squat;Transfers;Stairs    Examination-Participation Restrictions Cleaning;Yard Work;Community Activity;Laundry;Occupation    Stability/Clinical Decision Making Evolving/Moderate complexity    Clinical Decision Making Moderate    Rehab Potential Good    PT Frequency 2x / week    PT Duration 8 weeks    PT Treatment/Interventions ADLs/Self Care Home Management;Biofeedback;Cryotherapy;Electrical Stimulation;Iontophoresis 4mg /ml Dexamethasone;Moist Heat;Traction;Ultrasound;Gait training;Stair training;Functional mobility training;Therapeutic activities;Therapeutic exercise;Balance training;Neuromuscular re-education;Patient/family education;Manual techniques;Manual lymph drainage;Compression bandaging;Passive range of  motion;Dry needling;Energy conservation;Taping;Splinting;Vestibular;Spinal Manipulations;Joint Manipulations    PT Next Visit Plan SIJ mobility,  hip MMT/5xSTS, beighton scale, [place] to look at lumbar sacrum mobility, lumbar redic tests, stair assessment    PT Home Exercise Plan seated cat/cow    Consulted and Agree with Plan of Care Patient             Patient will benefit from skilled therapeutic intervention in order to improve the following deficits and impairments:  Abnormal gait, Decreased activity tolerance, Decreased endurance, Decreased strength, Dizziness, Hypomobility, Increased fascial restricitons, Impaired sensation, Improper body mechanics, Pain, Decreased mobility, Difficulty walking, Impaired tone, Postural dysfunction  Visit Diagnosis: Chronic bilateral low back pain with right-sided sciatica     Problem List Patient Active Problem List   Diagnosis Date Noted   Dizzy spells 08/26/2021   Lumbar back pain with radiculopathy affecting right lower extremity 08/26/2021   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   Dysthymia 08/26/2021   Severe obesity (BMI >= 40) (HCC) 07/20/2017   Iron deficiency anemia 08/02/2013   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   GERD (gastroesophageal reflux disease) 04/17/2013     Durwin Reges DPT Claiborne Billings O'Daniel, SPT Durwin Reges, PT 10/07/2021, 2:39 PM  [facility] [address] [place], [address] Phone: [phone]   Fax:  [phone]  Name: LUGENE DOLLISON MRN: [id number] Date of Birth: [birth year]

## 2021-10-12 ENCOUNTER — Ambulatory Visit (INDEPENDENT_AMBULATORY_CARE_PROVIDER_SITE_OTHER): Payer: Medicaid Other

## 2021-10-12 ENCOUNTER — Other Ambulatory Visit: Payer: Self-pay

## 2021-10-12 DIAGNOSIS — R931 Abnormal findings on diagnostic imaging of heart and coronary circulation: Secondary | ICD-10-CM | POA: Diagnosis not present

## 2021-10-12 LAB — ECHOCARDIOGRAM COMPLETE
AR max vel: 3.13 cm2
AV Area VTI: 2.98 cm2
AV Area mean vel: 3.33 cm2
AV Mean grad: 7 mmHg
AV Peak grad: 11.6 mmHg
Ao pk vel: 1.7 m/s
Area-P 1/2: 2.76 cm2
Calc EF: 69 %
S' Lateral: 2.9 cm
Single Plane A2C EF: 70.1 %
Single Plane A4C EF: 67.6 %

## 2021-10-14 ENCOUNTER — Ambulatory Visit: Payer: Medicaid Other | Admitting: Physical Therapy

## 2021-10-15 ENCOUNTER — Ambulatory Visit: Payer: Medicaid Other | Admitting: Cardiology

## 2021-10-15 ENCOUNTER — Telehealth: Payer: Self-pay | Admitting: Cardiology

## 2021-10-15 NOTE — Telephone Encounter (Signed)
Spoke with patient and she stated that she wanted to talk to Dr. Azucena Cecil today about her Echocardiogram. After reviewing her Echo results, I informed her that her Echo was normal. She stated that she had googled Left Ventricular Hypertrophy, and that it is, "not normal".  I advised her that she would need to have an appointment with Dr. Azucena Cecil to discuss her concerns as he does not do telephone encounters. Patient verbalized understanding and agreed with plan.

## 2021-10-15 NOTE — Telephone Encounter (Signed)
Pt c/o of Chest Pain: STAT if CP now or developed within 24 hours  1. Are you having CP right now?  Now center chest and stomach feels like fullness and cramping   2. Are you experiencing any other symptoms (ex. SOB, nausea, vomiting, sweating)? Stomach and gas pain   3. How long have you been experiencing CP? Since last month or so   4. Is your CP continuous or coming and going? Comes and goes   5. Have you taken Nitroglycerin? No   Patient taking iron pills and c/o of chest and stomach pain and thinks its not cardiac related.    Patient had 8 am appt with [facility] and is calling at 820 to change to phone visit today or another day.   Rescheduled visit to 10-16-21 at 140 pm .  Please advise scheduling if virtual visit ok for this patient .

## 2021-10-16 ENCOUNTER — Ambulatory Visit: Payer: Medicaid Other | Admitting: Cardiology

## 2021-10-16 ENCOUNTER — Telehealth: Payer: Self-pay

## 2021-10-16 NOTE — Telephone Encounter (Signed)
Pt has been having ongoing back pain wants to see about getting MRI?

## 2021-10-16 NOTE — Telephone Encounter (Signed)
Copied from CRM [id number]. Topic: General - Other >> Oct 16, 2021  2:23 PM Gaetana Michaelis A wrote: Reason for CRM: The patient has called to request orders for an MRI   Please contact further when possible

## 2021-10-19 ENCOUNTER — Encounter: Payer: Self-pay | Admitting: Cardiology

## 2021-10-19 NOTE — Telephone Encounter (Signed)
Spoke with patient and informed her that MRI are ordered based on medical necessity so that her insurance will cover it, if not she would be responsible for a huge bill.  I also informed patient that the provider consulted with the other specialists that the patient saw (PT and cardiology) and there still wasn't enough documentation to order a full body MRI at this time.  Patient just stated ok.

## 2021-10-20 ENCOUNTER — Ambulatory Visit: Payer: Medicaid Other | Admitting: Physical Therapy

## 2021-10-20 ENCOUNTER — Ambulatory Visit: Payer: Medicaid Other | Admitting: Internal Medicine

## 2021-10-21 ENCOUNTER — Encounter: Payer: Self-pay | Admitting: Internal Medicine

## 2021-10-21 ENCOUNTER — Telehealth: Payer: Self-pay | Admitting: Family Medicine

## 2021-10-21 ENCOUNTER — Telehealth: Payer: Self-pay

## 2021-10-21 DIAGNOSIS — R42 Dizziness and giddiness: Secondary | ICD-10-CM

## 2021-10-21 NOTE — Telephone Encounter (Signed)
Pt requesting a call back from PCP or nurse stated she is not happy that the providers she has seen are not giving her any treatment or medication when diagnosing her and only telling her she needs to sit down.   Pt requested a call back from a member of staff.

## 2021-10-21 NOTE — Telephone Encounter (Signed)
Copied from [place] [phone]. Topic: Referral - Request for Referral >> Oct 21, 2021  9:04 AM McGill, Nelva Bush wrote: Has patient seen PCP for this complaint? Yes.   *If NO, is insurance requiring patient see PCP for this issue before PCP can refer them? Referral for which specialty: Neurologist Preferred provider/office: N/A Reason for referral: The patient who is requesting a neurologist referral stated that she was previously given one but missed the appointment, and the doctor will not see her.

## 2021-10-21 NOTE — Telephone Encounter (Signed)
Pt was informed.

## 2021-10-21 NOTE — Telephone Encounter (Signed)
Called patient stated referral has been placed to Neuro and she could sch. An appt to discuss symptoms with provider

## 2021-10-22 ENCOUNTER — Telehealth: Payer: Self-pay | Admitting: Internal Medicine

## 2021-10-22 ENCOUNTER — Ambulatory Visit: Payer: Medicaid Other | Admitting: Physical Therapy

## 2021-10-22 DIAGNOSIS — D509 Iron deficiency anemia, unspecified: Secondary | ICD-10-CM

## 2021-10-22 NOTE — Telephone Encounter (Signed)
Patient called requesting repeat CBC, iron panel as she is trying to donate plasma. I do not recommend donating plasma, has hgb and iron were low on 09/16/21.

## 2021-10-22 NOTE — Telephone Encounter (Signed)
Pt.notified

## 2021-10-23 ENCOUNTER — Ambulatory Visit: Payer: Medicaid Other | Admitting: Family Medicine

## 2021-10-23 NOTE — Progress Notes (Deleted)
Name: Yesenia Thomas   MRN: [id number]    DOB: 1980-11-20   Date:10/23/2021       Progress Note  Subjective  Chief Complaint  Sore Throat/Sweats  HPI  *** Patient Active Problem List   Diagnosis Date Noted   Dizzy spells 08/26/2021   Lumbar back pain with radiculopathy affecting right lower extremity 08/26/2021   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   Dysthymia 08/26/2021   Severe obesity (BMI >= 40) (HCC) 07/20/2017   Iron deficiency anemia 08/02/2013   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   GERD (gastroesophageal reflux disease) 04/17/2013    Past Surgical History:  Procedure Laterality Date   CESAREAN SECTION      Family History  Problem Relation Age of Onset   Hypertension Mother    Thrombosis Maternal Grandmother    Clotting disorder Maternal Grandfather    Deep vein thrombosis Maternal Grandfather    Heart attack Paternal Grandmother     Social History   Tobacco Use   Smoking status: Never   Smokeless tobacco: Never  Substance Use Topics   Alcohol use: Not Currently     Current Outpatient Medications:    Accu-Chek Softclix Lancets lancets, Use as instructed, Disp: 100 each, Rfl: 12   Acetaminophen 500 MG coapsule, Take by mouth as needed., Disp: , Rfl:    blood glucose meter kit and supplies KIT, Dispense based on patient and insurance preference. Use up to four times daily as directed., Disp: 1 each, Rfl: 0   famotidine (PEPCID) 20 MG tablet, Take 20 mg by mouth 2 (two) times daily as needed for heartburn or indigestion., Disp: , Rfl:    ferrous gluconate (FERGON) 324 MG tablet, Take 1 tablet (324 mg total) by mouth daily., Disp: 90 tablet, Rfl: 3   glucose blood test strip, Use as instructed, Disp: 100 each, Rfl: 12   ibuprofen (ADVIL) 200 MG tablet, Take 200 mg by mouth every 6 (six) hours as needed., Disp: , Rfl:   Allergies  Allergen Reactions   Lactose Intolerance (Gi)    Percocet [Oxycodone-Acetaminophen]     I personally reviewed active problem  list, medication list, allergies, family history, social history, health maintenance with the patient/caregiver today.   ROS  ***  Objective  There were no vitals filed for this visit.  There is no height or weight on file to calculate BMI.  Physical Exam ***  Recent Results (from the past 2160 hour(s))  COMPLETE METABOLIC PANEL WITH GFR     Status: Abnormal   Collection Time: 09/16/21 12:01 PM  Result Value Ref Range   Glucose, Bld 99 65 - 99 mg/dL    Comment: .            Fasting reference interval .    BUN 9 7 - 25 mg/dL   Creat 0.74 0.50 - 0.99 mg/dL   eGFR 105 > OR = 60 mL/min/1.43m    Comment: The eGFR is based on the CKD-EPI 2021 equation. To calculate  the new eGFR from a previous Creatinine or Cystatin C result, go to https://www.kidney.org/professionals/ kdoqi/gfr%5Fcalculator    BUN/Creatinine Ratio NOT APPLICABLE 6 - 22 (calc)   Sodium 142 135 - 146 mmol/L   Potassium 3.7 3.5 - 5.3 mmol/L   Chloride 107 98 - 110 mmol/L   CO2 28 20 - 32 mmol/L   Calcium 8.7 8.6 - 10.2 mg/dL   Total Protein 5.5 (L) 6.1 - 8.1 g/dL   Albumin 3.2 (L) 3.6 - 5.1 g/dL   Globulin  2.3 1.9 - 3.7 g/dL (calc)   AG Ratio 1.4 1.0 - 2.5 (calc)   Total Bilirubin 0.3 0.2 - 1.2 mg/dL   Alkaline phosphatase (APISO) 42 31 - 125 U/L   AST 14 10 - 30 U/L   ALT 14 6 - 29 U/L  CBC w/Diff/Platelet     Status: Abnormal   Collection Time: 09/16/21 12:01 PM  Result Value Ref Range   WBC 9.2 3.8 - 10.8 Thousand/uL   RBC 4.65 3.80 - 5.10 Million/uL   Hemoglobin 11.0 (L) 11.7 - 15.5 g/dL   HCT 35.9 35.0 - 45.0 %   MCV 77.2 (L) 80.0 - 100.0 fL   MCH 23.7 (L) 27.0 - 33.0 pg   MCHC 30.6 (L) 32.0 - 36.0 g/dL   RDW 17.1 (H) 11.0 - 15.0 %   Platelets 318 140 - 400 Thousand/uL   MPV 9.4 7.5 - 12.5 fL   Neutro Abs 5,106 1,500 - 7,800 cells/uL   Lymphs Abs 3,284 850 - 3,900 cells/uL   Absolute Monocytes 570 200 - 950 cells/uL   Eosinophils Absolute 202 15 - 500 cells/uL   Basophils Absolute 37 0 -  200 cells/uL   Neutrophils Relative % 55.5 %   Total Lymphocyte 35.7 %   Monocytes Relative 6.2 %   Eosinophils Relative 2.2 %   Basophils Relative 0.4 %  TSH     Status: None   Collection Time: 09/16/21 12:01 PM  Result Value Ref Range   TSH 1.58 mIU/L    Comment:           Reference Range .           > or = 20 Years  0.40-4.50 .                Pregnancy Ranges           First trimester    0.26-2.66           Second trimester   0.55-2.73           Third trimester    0.43-2.91   HgB A1c     Status: None   Collection Time: 09/16/21 12:01 PM  Result Value Ref Range   Hgb A1c MFr Bld 5.6 <5.7 % of total Hgb    Comment: For the purpose of screening for the presence of diabetes: . <5.7%       Consistent with the absence of diabetes 5.7-6.4%    Consistent with increased risk for diabetes             (prediabetes) > or =6.5%  Consistent with diabetes . This assay result is consistent with a decreased risk of diabetes. . Currently, no consensus exists regarding use of hemoglobin A1c for diagnosis of diabetes in children. . According to American Diabetes Association (ADA) guidelines, hemoglobin A1c <7.0% represents optimal control in non-pregnant diabetic patients. Different metrics may apply to specific patient populations.  Standards of Medical Care in Diabetes(ADA). .    Mean Plasma Glucose 114 mg/dL   eAG (mmol/L) 6.3 mmol/L  Iron, TIBC and Ferritin Panel     Status: Abnormal   Collection Time: 09/16/21 12:01 PM  Result Value Ref Range   Iron 27 (L) 40 - 190 mcg/dL   TIBC 297 250 - 450 mcg/dL (calc)   %SAT 9 (L) 16 - 45 % (calc)   Ferritin 18 16 - 154 ng/mL  TEST AUTHORIZATION 2     Status: None   Collection Time: 09/16/21  12:01 PM  Result Value Ref Range   TEST NAME: IRON, TIBC AND FERRITIN PANEL    TEST CODE: [id number]    CLIENT CONTACT: ELISABETH ANDREWS    REPORT ALWAYS MESSAGE SIGNATURE      Comment: . The laboratory testing on this patient was verbally  requested or confirmed by the ordering physician or his or her authorized representative after contact with an employee of [employer]. Federal regulations require that we maintain on file written authorization for all laboratory testing.  Accordingly we are asking that the ordering physician or his or her authorized representative sign a copy of this report and promptly return it to the client service representative. . . Signature:____________________________________________________ . Please fax this signed page to [phone] or return it via your [employer] courier.   ECHOCARDIOGRAM COMPLETE     Status: None   Collection Time: 10/12/21 11:54 AM  Result Value Ref Range   AR max vel 3.13 cm2   AV Peak grad 11.6 mmHg   Ao pk vel 1.70 m/s   S' Lateral 2.90 cm   Area-P 1/2 2.76 cm2   AV Area VTI 2.98 cm2   AV Mean grad 7.0 mmHg   Single Plane A4C EF 67.6 %   Single Plane A2C EF 70.1 %   Calc EF 69.0 %   AV Area mean vel 3.33 cm2    PHQ2/9: Depression screen [facility] 2/9 09/28/2021 09/16/2021 08/26/2021 02/24/2021 12/16/2020  Decreased Interest 1 2 2 1 3   Down, Depressed, Hopeless 1 1 2 1 2   PHQ - 2 Score 2 3 4 2 5   Altered sleeping 0 0 3 0 3  Tired, decreased energy 0 0 3 1 3   Change in appetite 0 0 0 0 0  Feeling bad or failure about yourself  0 0 2 0 2  Trouble concentrating 0 0 3 0 2  Moving slowly or fidgety/restless 0 0 0 0 1  Suicidal thoughts 0 0 0 0 0  PHQ-9 Score 2 3 15 3 16   Difficult doing work/chores Not difficult at all Not difficult at all Very difficult Not difficult at all Not difficult at all    phq 9 is {gen pos XLE:174715}   Fall Risk: Fall Risk  09/28/2021 09/16/2021 08/26/2021 02/24/2021 12/16/2020  Falls in the past year? 0 0 0 1 0  Number falls in past yr: 0 0 - 1 0  Injury with Fall? 0 0 - 0 0  Risk for fall due to : No Fall Risks - - History of fall(s);Impaired balance/gait -  Follow up Falls prevention discussed - Falls prevention discussed  Falls evaluation completed -      Functional Status Survey:      Assessment & Plan  *** There are no diagnoses linked to this encounter.

## 2021-10-27 ENCOUNTER — Ambulatory Visit: Payer: Medicaid Other | Admitting: Physical Therapy

## 2021-10-28 ENCOUNTER — Other Ambulatory Visit: Payer: Self-pay

## 2021-10-28 ENCOUNTER — Ambulatory Visit: Payer: Medicaid Other | Attending: Physician Assistant | Admitting: Physical Therapy

## 2021-10-28 ENCOUNTER — Encounter: Payer: Self-pay | Admitting: Physical Therapy

## 2021-10-28 DIAGNOSIS — M5441 Lumbago with sciatica, right side: Secondary | ICD-10-CM | POA: Diagnosis not present

## 2021-10-28 DIAGNOSIS — G8929 Other chronic pain: Secondary | ICD-10-CM | POA: Insufficient documentation

## 2021-10-28 NOTE — Therapy (Signed)
[facility] [facility] [address] [place], [address] Phone: [phone]   Fax:  [phone]  Physical Therapy Treatment  Patient Details  Name: Yesenia Thomas MRN: [id number] Date of Birth: 1981-02-21 Referring Provider (PT): Denny Peon Mecum   Encounter Date: 10/28/2021   PT End of Session - 10/28/21 1042     Visit Number 2    Number of Visits 18    Date for PT Re-Evaluation 12/04/21    Authorization - Visit Number 1    Authorization - Number of Visits 18    PT Start Time 0825    PT Stop Time 0903    PT Time Calculation (min) 38 min    Activity Tolerance Patient tolerated treatment well    Behavior During Therapy [facility] for tasks assessed/performed             Past Medical History:  Diagnosis Date   ADHD    Anxiety    Chlamydia    Depression    GERD (gastroesophageal reflux disease)    Iron deficiency anemia    Morbid obesity due to excess calories (HCC)    PPD positive    Pregnancy induced hypertension    Trauma    Varicella     Past Surgical History:  Procedure Laterality Date   CESAREAN SECTION      There were no vitals filed for this visit.   Subjective Assessment - 10/28/21 0827     Subjective Pt states that her tooth hurts and that she did not sleep well last night due to mouth pain. She endorses 10/10 pain in low back and hip, R>L. She reports that she has discomfort in all positions except prone and supine. She also mentions that her abdomen swells up after sitting for 20 minutes and that it feels like her c-section scar is going to tear open (c-section performed in 2014) - PT encouraged pt to bring this concern up to her provider.    Pertinent History 41 y.o. female pnt presents with chronic low back pain and N/T that travels down right leg with insidious onset. Pnt has been dealing with sx for approx. 5 years. Sx include "dull" pain in her low back and sacrum and "burining" pain going  down her right leg. Pain on NPS at worst is 8/10, best 0/10, and currently 0/10. Aggrevating factors include standing >5 min, negotiating >1 steps, sitting for >5 min., and transfers while sleeping in bed. Pnt states these impairments have lead to decreased participation in cooking, cleaning, and walking to lose weight. Pnt currently works at home and reports pain with sitting to complete occupational duties. Easing factors include heat packs, NSAIDs, and lumbar extension. Pnt reports pain gets worse throughout the day. PMH includes multiple pregnancies, hx of lowback pain, and a fall in 2014. 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 Pnt goals include "getting to the root of her pain" and decreasing daily pain that limits functional ADLs. Pnt denys significant weight change, B/B dysfunction, nausea/vomitting, fever, and night pain    Limitations Sitting;Standing;Walking;House hold activities;Lifting    How long can you sit comfortably? 5 min    How long can you stand comfortably? 5 min    How long can you walk comfortably? 5 min    Diagnostic tests X-ray on bilateral hips in June 2022- DJD    Patient Stated Goals "getting to the root of her pain" and decreasing daily pain  that limits functional ADLs    Currently in Pain? Yes    Pain Score 10-Worst pain ever    Pain Location Back    Pain Orientation Right;Left;Lower    Pain Descriptors / Indicators Aching;Discomfort    Pain Type Chronic pain    Pain Onset More than a month ago    Pain Frequency Constant              LOW BACK PAIN N/T RIGHT LEG  Extension feels good   INTERVENTIONS   Manual Therapy STM utilizing effleurage, petrissage and deep pressure with TP release in R paraspinals, R QL and bilateral glute max, glute med and piriformis. Rolling stick also utilized for deep pressure due to body habitus.   Educated on trigger point dry needling, risks and benefits. Pt states she would like to think about  it.    Therapeutic Exercise Lumbar rotation in hooklying with over pressure by therapist, 2x30 seconds each side  TA activation* in hooklying x10  Bridge* x10 reps Alternating march* in hooklying with cueing for TA activation  HEP created, reviewed and provided with exercises marked with (*) above. Pt performed within session; all questions answered.    Clinical Impression: Patient arrives to session with significant pain. TP identified in bilateral paraspinals, right more sensitive than left, right QL and bilateral hips/glutes. STM was performed for pain modulation utilizing multiple techniques. Pt is appropriate for TDN - education provided however pt unsure and states she will think about it. STM was followed by lumbar rotation and core stabilization interventions; pt reporting pain free. At end of session, pt states that her back is feeling better, especially when she is not thinking about back pain (such as focusing on exercises). HEP of above exercises provided. Patient will continue to benefit from skilled physical therapy to address pain and deficits in strength for improved QOL and ability to care for her family.             PT Short Term Goals - 10/07/21 1023       PT SHORT TERM GOAL #1   Title Pnt will decrease max NPS from 8/10 to 6/10 to improve ability to participate in community ADLs    Baseline 10/07/21 worst NPS 8/10    Time 4    Period Weeks    Status New    Target Date 11/04/21      PT SHORT TERM GOAL #2   Title pnt will have 0/10 pain while sitting at desk for work for at least 30 min. to complete occupational ADLs    Baseline 10/07/21 limited to 5 min    Time 4    Period Weeks    Status New    Target Date 11/04/21      PT SHORT TERM GOAL #3   Title pnt will be able to perform bed mobilty with 6/10 pain on NPS to improve quality of sleep    Baseline 10/07/21 NPS 8/10    Time 4    Period Weeks    Status New    Target Date 11/04/21                PT Long Term Goals - 10/07/21 1029       PT LONG TERM GOAL #1   Title Pt will demonstrate 5xSTS of 10sec or less to demonstrate age matched BLE norms needed for funcitonal transfers    Baseline 10/07/21 unable to complete one STS without UE use    Time  8    Period Weeks    Status New    Target Date 12/02/21      PT LONG TERM GOAL #2   Title pnt will be able to negotiate 4 steps with 0/10 pain on NPS to increase community navigation    Baseline 10/07/21 limited to 1 step with subjective increased pain    Time 8    Period Weeks    Status New    Target Date 12/02/21      PT LONG TERM GOAL #3   Title Pnt will be able to stand for 30 min with less than 4/10 pain on NPS in order to cook at home    Baseline 10/07/21 cannot stand for more than 5 min without having to rest due to pain      PT LONG TERM GOAL #4   Title Patient will increase FOTO score to 60 to demonstrate predicted increase in functional mobility to complete ADLs    Baseline 10/07/21 48    Time 8    Period Weeks    Status New                   Plan - 10/28/21 1058     Clinical Impression Statement Patient arrives to session with significant pain. TP identified in bilateral paraspinals, right more sensitive than left, right QL and bilateral hips/glutes. STM was performed for pain modulation utilizing multiple techniques. Pt is appropriate for TDN - education provided however pt unsure and states she will think about it. STM was followed by lumbar rotation and core stabilization interventions; pt reporting pain free. At end of session, pt states that her back is feeling better, especially when she is not thinking about back pain (such as focusing on exercises). HEP of above exercises provided. Patient will continue to benefit from skilled physical therapy to address pain and deficits in strength for improved QOL and ability to care for her family.    Personal Factors and Comorbidities Fitness;Time since onset of  injury/illness/exacerbation;Comorbidity 1    Comorbidities BMI    Examination-Activity Limitations Carry;Lift;Sit;Stand;Bed Mobility;Locomotion Level;Sleep;Bend;Squat;Transfers;Stairs    Examination-Participation Restrictions Cleaning;Yard Work;Community Activity;Laundry;Occupation    Stability/Clinical Decision Making Evolving/Moderate complexity    Rehab Potential Good    PT Frequency 2x / week    PT Duration 8 weeks    PT Treatment/Interventions ADLs/Self Care Home Management;Biofeedback;Cryotherapy;Electrical Stimulation;Iontophoresis 4mg /ml Dexamethasone;Moist Heat;Traction;Ultrasound;Gait training;Stair training;Functional mobility training;Therapeutic activities;Therapeutic exercise;Balance training;Neuromuscular re-education;Patient/family education;Manual techniques;Manual lymph drainage;Compression bandaging;Passive range of motion;Dry needling;Energy conservation;Taping;Splinting;Vestibular;Spinal Manipulations;Joint Manipulations    PT Next Visit Plan SIJ mobility, hip MMT/5xSTS, beighton scale, [place] to look at lumbar sacrum mobility, lumbar redic tests, stair assessment    PT Home Exercise Plan MedBridge handout for core stabilization    Consulted and Agree with Plan of Care Patient             Patient will benefit from skilled therapeutic intervention in order to improve the following deficits and impairments:  Abnormal gait, Decreased activity tolerance, Decreased endurance, Decreased strength, Dizziness, Hypomobility, Increased fascial restricitons, Impaired sensation, Improper body mechanics, Pain, Decreased mobility, Difficulty walking, Impaired tone, Postural dysfunction  Visit Diagnosis: Chronic bilateral low back pain with right-sided sciatica     Problem List Patient Active Problem List   Diagnosis Date Noted   Dizzy spells 08/26/2021   Lumbar back pain with radiculopathy affecting right lower extremity 08/26/2021   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    Dysthymia 08/26/2021   Severe obesity (BMI >= 40) (HCC) 07/20/2017  Iron deficiency anemia 08/02/2013   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   GERD (gastroesophageal reflux disease) 04/17/2013    Basilia Jumbo PT, DPT   [facility] PH[facility] SPORTS MEDICINE [address], [place], [address] Phone: [phone]   Fax:  [phone]  Name: Yesenia Thomas MRN: [id number] Date of Birth: 1981-09-12

## 2021-10-29 ENCOUNTER — Ambulatory Visit: Payer: Medicaid Other | Admitting: Physical Therapy

## 2021-11-02 ENCOUNTER — Ambulatory Visit: Payer: Medicaid Other | Admitting: Physician Assistant

## 2021-11-03 ENCOUNTER — Other Ambulatory Visit: Payer: Self-pay

## 2021-11-03 ENCOUNTER — Ambulatory Visit: Payer: Medicaid Other | Admitting: Physical Therapy

## 2021-11-03 NOTE — Patient Instructions (Incomplete)
LOW BACK PAIN N/T RIGHT LEG   Extension feels good    INTERVENTIONS    Manual Therapy STM utilizing effleurage, petrissage and deep pressure with TP release in R paraspinals, R QL and bilateral glute max, glute med and piriformis. Rolling stick also utilized for deep pressure due to body habitus.    Educated on trigger point dry needling, risks and benefits. Pt states she would like to think about it.      Therapeutic Exercise Lumbar rotation in hooklying with over pressure by therapist, 2x30 seconds each side  TA activation* in hooklying x10  Bridge* x10 reps Alternating march* in hooklying with cueing for TA activation   HEP created, reviewed and provided with exercises marked with (*) above. Pt performed within session; all questions answered.      Clinical Impression: Patient arrives to session with significant pain. TP identified in bilateral paraspinals, right more sensitive than left, right QL and bilateral hips/glutes. STM was performed for pain modulation utilizing multiple techniques. Pt is appropriate for TDN - education provided however pt unsure and states she will think about it. STM was followed by lumbar rotation and core stabilization interventions; pt reporting pain free. At end of session, pt states that her back is feeling better, especially when she is not thinking about back pain (such as focusing on exercises). HEP of above exercises provided. Patient will continue to benefit from skilled physical therapy to address pain and deficits in strength for improved QOL and ability to care for her family.

## 2021-11-05 ENCOUNTER — Ambulatory Visit: Payer: Medicaid Other | Admitting: Physical Therapy

## 2021-11-10 ENCOUNTER — Ambulatory Visit: Payer: Medicaid Other | Admitting: Physical Therapy

## 2021-11-10 ENCOUNTER — Other Ambulatory Visit: Payer: Self-pay

## 2021-11-10 DIAGNOSIS — M5441 Lumbago with sciatica, right side: Secondary | ICD-10-CM | POA: Diagnosis not present

## 2021-11-10 DIAGNOSIS — G8929 Other chronic pain: Secondary | ICD-10-CM

## 2021-11-10 NOTE — Therapy (Signed)
[facility] [address] [place], [address] Phone: [phone]   Fax:  [phone]  Physical Therapy Treatment  Patient Details  Name: Yesenia Thomas MRN: [id number] Date of Birth: 09/18/1980 Referring Provider (PT): Erin Mecum   Encounter Date: 11/10/2021   PT End of Session - 11/10/21 1103     Visit Number 3    Number of Visits 17    Date for PT Re-Evaluation 12/04/21    Progress Note Due on Visit 10    PT Start Time 0958    PT Stop Time 1033    PT Time Calculation (min) 35 min    Activity Tolerance Patient tolerated treatment well    Behavior During Therapy [facility] for tasks assessed/performed             Past Medical History:  Diagnosis Date   ADHD    Anxiety    Chlamydia    Depression    GERD (gastroesophageal reflux disease)    Iron deficiency anemia    Morbid obesity due to excess calories (HCC)    PPD positive    Pregnancy induced hypertension    Trauma    Varicella     Past Surgical History:  Procedure Laterality Date   CESAREAN SECTION      There were no vitals filed for this visit.   Subjective Assessment - 11/10/21 1005     Subjective Pt states she was in a lot of pain last night due to increased activity yesterday (NPS 15/10). Unable to sleep, she took an 800mg  Tylenol to alleviate pain. Today she reports 4/10 pain. States she is sometimes compliant with HEP and would like a new handout.    Pertinent History 41 y.o. female pnt presents with chronic low back pain and N/T that travels down right leg with insidious onset. Pnt has been dealing with sx for approx. 5 years. Sx include "dull" pain in her low back and sacrum and "burining" pain going down her right leg. Pain on NPS at worst is 8/10, best 0/10, and currently 0/10. Aggrevating factors include standing >5 min, negotiating >1 steps, sitting for >5 min., and transfers while sleeping in bed. Pnt states these impairments  have lead to decreased participation in cooking, cleaning, and walking to lose weight. Pnt currently works at home and reports pain with sitting to complete occupational duties. Easing factors include heat packs, NSAIDs, and lumbar extension. Pnt reports pain gets worse throughout the day. PMH includes multiple pregnancies, hx of lowback pain, and a fall in 2014. 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 Pnt goals include "getting to the root of her pain" and decreasing daily pain that limits functional ADLs. Pnt denys significant weight change, B/B dysfunction, nausea/vomitting, fever, and night pain    Limitations Sitting;Standing;Walking;House hold activities;Lifting    How long can you sit comfortably? 5 min    How long can you stand comfortably? 5 min    How long can you walk comfortably? 5 min    Diagnostic tests X-ray on bilateral hips in June 2022- DJD    Patient Stated Goals "getting to the root of her pain" and decreasing daily pain that limits functional ADLs    Currently in Pain? Yes    Pain Score 4     Pain Location Back    Pain Orientation Right;Lower    Pain Descriptors / Indicators Aching;Discomfort    Pain Type Chronic pain  Pain Radiating Towards RLE    Pain Onset More than a month ago    Pain Frequency Constant               **LOW BACK PAIN **N/T RIGHT LEG Extension feels good     INTERVENTIONS    Manual Therapy  Hamstring stretch RLE, 2 x60 seconds Sciatic nerve flossing RLE, x10 reps Piriformis stretch RLE, 2 x30 seconds     Therapeutic Exercise NuStep LE only for warmup, level 3, 5 minutes; Lumbar rotation in hooklying with over pressure by therapist, x10 each side  TA activation in hooklying with alternating march 3x10 each side Bridge 3x10 reps Hooklying hip abduction BTB 3x10   MedBridge Access Code: [email or web address]  Exercises Supine Bridge - 1 x daily - 3 x weekly - 3 sets - 10 reps Hooklying Clamshell with Resistance -  1 x daily - 3 x weekly - 3 sets - 10 reps Supine 90/90 Alternating Heel Touches with Posterior Pelvic Tilt - 1 x daily - 3 x weekly - 3 sets - 10 reps Supine March with Posterior Pelvic Tilt - 1 x daily - 3 x weekly - 3 sets - 10 reps Seated Hamstring Stretch - 1 x daily - 7 x weekly - 3 sets - 30 hold Seated Piriformis Stretch with Trunk Bend - 1 x daily - 7 x weekly - 3 sets - 30 hold Supine Lower Trunk Rotation - 1 x daily - 7 x weekly - 3 sets - 10 reps     Clinical Impression: Patient arrives to session with moderate pain. Therapeutic exercises were continued with some new exercises being initiated. Focus on core, hip and glute strengthening. Sciatic nerve flossing provoked N/T down the RLE. Flossing was performed and followed by piriformis stretch with pt reporting decrease in symptoms. Session was limited overall by pt arriving 15 minutes late. HEP performed in session and handout/theraband provided. PT discussed scheduling, timeliness and consistency with appearing to sessions; pt reports understanding and stopped by the front desk to schedule more convenient appointments for the future. Patient will continue to benefit from skilled physical therapy to address pain and deficits in strength for improved QOL and ability to care for her family.             PT Short Term Goals - 10/07/21 1023       PT SHORT TERM GOAL #1   Title Pnt will decrease max NPS from 8/10 to 6/10 to improve ability to participate in community ADLs    Baseline 10/07/21 worst NPS 8/10    Time 4    Period Weeks    Status New    Target Date 11/04/21      PT SHORT TERM GOAL #2   Title pnt will have 0/10 pain while sitting at desk for work for at least 30 min. to complete occupational ADLs    Baseline 10/07/21 limited to 5 min    Time 4    Period Weeks    Status New    Target Date 11/04/21      PT SHORT TERM GOAL #3   Title pnt will be able to perform bed mobilty with 6/10 pain on NPS to improve quality of  sleep    Baseline 10/07/21 NPS 8/10    Time 4    Period Weeks    Status New    Target Date 11/04/21               PT Long Term  Goals - 10/07/21 1029       PT LONG TERM GOAL #1   Title Pt will demonstrate 5xSTS of 10sec or less to demonstrate age matched BLE norms needed for funcitonal transfers    Baseline 10/07/21 unable to complete one STS without UE use    Time 8    Period Weeks    Status New    Target Date 12/02/21      PT LONG TERM GOAL #2   Title pnt will be able to negotiate 4 steps with 0/10 pain on NPS to increase community navigation    Baseline 10/07/21 limited to 1 step with subjective increased pain    Time 8    Period Weeks    Status New    Target Date 12/02/21      PT LONG TERM GOAL #3   Title Pnt will be able to stand for 30 min with less than 4/10 pain on NPS in order to cook at home    Baseline 10/07/21 cannot stand for more than 5 min without having to rest due to pain      PT LONG TERM GOAL #4   Title Patient will increase FOTO score to 60 to demonstrate predicted increase in functional mobility to complete ADLs    Baseline 10/07/21 48    Time 8    Period Weeks    Status New                   Plan - 11/10/21 1104     Clinical Impression Statement Patient arrives to session with moderate pain. Therapeutic exercises were continued with some new exercises being initiated. Focus on core, hip and glute strengthening. Sciatic nerve flossing provoked N/T down the RLE. Flossing was performed and followed by piriformis stretch with pt reporting decrease in symptoms. Session was limited overall by pt arriving 15 minutes late. HEP performed in session and handout/theraband provided. PT discussed scheduling, timeliness and consistency with appearing to sessions; pt reports understanding and stopped by the front desk to schedule more convenient appointments for the future. Patient will continue to benefit from skilled physical therapy to address pain and  deficits in strength for improved QOL and ability to care for her family.    Personal Factors and Comorbidities Fitness;Time since onset of injury/illness/exacerbation;Comorbidity 1    Comorbidities BMI    Examination-Activity Limitations Carry;Lift;Sit;Stand;Bed Mobility;Locomotion Level;Sleep;Bend;Squat;Transfers;Stairs    Examination-Participation Restrictions Cleaning;Yard Work;Community Activity;Laundry;Occupation    Stability/Clinical Decision Making Evolving/Moderate complexity    Rehab Potential Good    PT Frequency 2x / week    PT Duration 8 weeks    PT Treatment/Interventions ADLs/Self Care Home Management;Biofeedback;Cryotherapy;Electrical Stimulation;Iontophoresis 4mg /ml Dexamethasone;Moist Heat;Traction;Ultrasound;Gait training;Stair training;Functional mobility training;Therapeutic activities;Therapeutic exercise;Balance training;Neuromuscular re-education;Patient/family education;Manual techniques;Manual lymph drainage;Compression bandaging;Passive range of motion;Dry needling;Energy conservation;Taping;Splinting;Vestibular;Spinal Manipulations;Joint Manipulations    PT Next Visit Plan SIJ mobility, hip MMT/5xSTS, beighton scale, lumbar sacrum mobility, lumbar redic tests, stair assessment    PT Home Exercise Plan MedBridge Access Code:    Consulted and Agree with Plan of Care Patient             Patient will benefit from skilled therapeutic intervention in order to improve the following deficits and impairments:  Abnormal gait, Decreased activity tolerance, Decreased endurance, Decreased strength, Dizziness, Hypomobility, Increased fascial restricitons, Impaired sensation, Improper body mechanics, Pain, Decreased mobility, Difficulty walking, Impaired tone, Postural dysfunction  Visit Diagnosis: Chronic bilateral low back pain with right-sided sciatica     Problem List Patient Active  Problem List   Diagnosis Date Noted   Dizzy spells 08/26/2021   Lumbar back  pain with radiculopathy affecting right lower extremity 08/26/2021   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   Dysthymia 08/26/2021   Severe obesity (BMI >= 40) (HCC) 07/20/2017   Iron deficiency anemia 08/02/2013   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   GERD (gastroesophageal reflux disease) 04/17/2013    Basilia JumboKerri Danni Leabo PT, DPT   [facility] [address] [place], [place], [id number] Phone: [phone]   Fax:  [phone]  Name: Yesenia Thomas MRN: [id number] Date of Birth: 04/19/1981

## 2021-11-12 ENCOUNTER — Ambulatory Visit: Payer: Medicaid Other | Attending: Physician Assistant | Admitting: Physical Therapy

## 2021-11-12 DIAGNOSIS — G8929 Other chronic pain: Secondary | ICD-10-CM | POA: Insufficient documentation

## 2021-11-12 DIAGNOSIS — M5441 Lumbago with sciatica, right side: Secondary | ICD-10-CM | POA: Insufficient documentation

## 2021-11-12 NOTE — Patient Instructions (Incomplete)
**  LOW BACK PAIN ?**N/T RIGHT LEG ?Extension feels good  ?  ?  ?INTERVENTIONS  ?  ?Manual Therapy  ?Hamstring stretch RLE, 2 x60 seconds ?Sciatic nerve flossing RLE, x10 reps ?Piriformis stretch RLE, 2 x30 seconds  ?  ?  ?Therapeutic Exercise ?NuStep LE only for warmup, level 3, 5 minutes; ?Lumbar rotation in hooklying with over pressure by therapist, x10 each side  ?TA activation in hooklying with alternating march 3x10 each side ?Bridge 3x10 reps ?Hooklying hip abduction BTB 3x10 ?  ?  ?MedBridge ?Access Code: [email or web address] ?  ?Exercises ?Supine Bridge - 1 x daily - 3 x weekly - 3 sets - 10 reps ?Hooklying Clamshell with Resistance - 1 x daily - 3 x weekly - 3 sets - 10 reps ?Supine 90/90 Alternating Heel Touches with Posterior Pelvic Tilt - 1 x daily - 3 x weekly - 3 sets - 10 reps ?Supine March with Posterior Pelvic Tilt - 1 x daily - 3 x weekly - 3 sets - 10 reps ?Seated Hamstring Stretch - 1 x daily - 7 x weekly - 3 sets - 30 hold ?Seated Piriformis Stretch with Trunk Bend - 1 x daily - 7 x weekly - 3 sets - 30 hold ?Supine Lower Trunk Rotation - 1 x daily - 7 x weekly - 3 sets - 10 reps ?  ?  ?Clinical Impression: Patient arrives to session with moderate pain. Therapeutic exercises were continued with some new exercises being initiated. Focus on core, hip and glute strengthening. Sciatic nerve flossing provoked N/T down the RLE. Flossing was performed and followed by piriformis stretch with pt reporting decrease in symptoms. Session was limited overall by pt arriving 15 minutes late. HEP performed in session and handout/theraband provided. PT discussed scheduling, timeliness and consistency with appearing to sessions; pt reports understanding and stopped by the front desk to schedule more convenient appointments for the future. Patient will continue to benefit from skilled physical therapy to address pain and deficits in strength for improved QOL and ability to care for her family.  ?  ?

## 2021-11-17 ENCOUNTER — Other Ambulatory Visit: Payer: Self-pay

## 2021-11-17 ENCOUNTER — Ambulatory Visit: Payer: Medicaid Other | Admitting: Physical Therapy

## 2021-11-17 DIAGNOSIS — M5441 Lumbago with sciatica, right side: Secondary | ICD-10-CM | POA: Diagnosis present

## 2021-11-17 DIAGNOSIS — G8929 Other chronic pain: Secondary | ICD-10-CM

## 2021-11-17 NOTE — Therapy (Signed)
[facility] [address] [place], [address] Phone: [phone]   Fax:  [phone]  Physical Therapy Treatment  Patient Details  Name: Yesenia Thomas MRN: [id number] Date of Birth: 05/29/1981 Referring Provider (PT): Denny Peon Mecum   Encounter Date: 11/17/2021   PT End of Session - 11/17/21 1038     Visit Number 4    Number of Visits 17    Date for PT Re-Evaluation 12/04/21    Progress Note Due on Visit 10    PT Start Time 0803    PT Stop Time 0830    PT Time Calculation (min) 27 min    Activity Tolerance Patient tolerated treatment well    Behavior During Therapy [facility] for tasks assessed/performed             Past Medical History:  Diagnosis Date   ADHD    Anxiety    Chlamydia    Depression    GERD (gastroesophageal reflux disease)    Iron deficiency anemia    Morbid obesity due to excess calories (HCC)    PPD positive    Pregnancy induced hypertension    Trauma    Varicella     Past Surgical History:  Procedure Laterality Date   CESAREAN SECTION      There were no vitals filed for this visit.   Subjective Assessment - 11/17/21 1036     Subjective Pt states she has been in a lot of pain over the past few days due to an increase in DoorDashing. She did have to navigate multiple flights of stairs. She performed the exercises that she could remember but lost her HEP handout and resistance band. Pain level has improved today.    Pertinent History 41 y.o. female pnt presents with chronic low back pain and N/T that travels down right leg with insidious onset. Pnt has been dealing with sx for approx. 5 years. Sx include "dull" pain in her low back and sacrum and "burining" pain going down her right leg. Pain on NPS at worst is 8/10, best 0/10, and currently 0/10. Aggrevating factors include standing >5 min, negotiating >1 steps, sitting for >5 min., and transfers while sleeping in bed. Pnt  states these impairments have lead to decreased participation in cooking, cleaning, and walking to lose weight. Pnt currently works at home and reports pain with sitting to complete occupational duties. Easing factors include heat packs, NSAIDs, and lumbar extension. Pnt reports pain gets worse throughout the day. PMH includes multiple pregnancies, hx of lowback pain, and a fall in 2014. 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 Pnt goals include "getting to the root of her pain" and decreasing daily pain that limits functional ADLs. Pnt denys significant weight change, B/B dysfunction, nausea/vomitting, fever, and night pain    Limitations Sitting;Standing;Walking;House hold activities;Lifting    How long can you sit comfortably? 5 min    How long can you stand comfortably? 5 min    How long can you walk comfortably? 5 min    Diagnostic tests X-ray on bilateral hips in June 2022- DJD    Patient Stated Goals "getting to the root of her pain" and decreasing daily pain that limits functional ADLs    Currently in Pain? Yes    Pain Score 5     Pain Location Back    Pain Orientation Right;Lower    Pain Descriptors / Indicators Aching;Discomfort  Pain Type Chronic pain    Pain Onset More than a month ago              **LOW BACK PAIN with N/T RIGHT LEG** Extension feels good      INTERVENTIONS    Manual Therapy  Hamstring stretch RLE, 2x60 seconds Sciatic nerve flossing RLE, 2x10 reps Piriformis stretch RLE, 2 x30 seconds     SIJ Testing: tested positive in 3 out of 5  -Very sensitive to pain in the following: distraction, compression and sacral thrust.    Therapeutic Exercise NuStep LE only for warmup, level 3, 5 minutes; Sidelying hip abduction RLE BTB 3x10     MedBridge - re-printed and provided via text  Access Code: [email or web address]       Clinical Impression: Patient arrives to session >15 minutes late. Redirecting required throughout session as pt  provided detailed information regarding pain and recent activities. 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 The 3 tests that were positive, pt was very pain sensitive to (listed above). Possible contribution of pain by SIJ. Interventions limited by tardiness and pain. Patient will continue to benefit from skilled physical therapy to address pain and deficits in strength for improved QOL and ability to care for her family.            PT Short Term Goals - 10/07/21 1023       PT SHORT TERM GOAL #1   Title Pnt will decrease max NPS from 8/10 to 6/10 to improve ability to participate in community ADLs    Baseline 10/07/21 worst NPS 8/10    Time 4    Period Weeks    Status New    Target Date 11/04/21      PT SHORT TERM GOAL #2   Title pnt will have 0/10 pain while sitting at desk for work for at least 30 min. to complete occupational ADLs    Baseline 10/07/21 limited to 5 min    Time 4    Period Weeks    Status New    Target Date 11/04/21      PT SHORT TERM GOAL #3   Title pnt will be able to perform bed mobilty with 6/10 pain on NPS to improve quality of sleep    Baseline 10/07/21 NPS 8/10    Time 4    Period Weeks    Status New    Target Date 11/04/21               PT Long Term Goals - 10/07/21 1029       PT LONG TERM GOAL #1   Title Pt will demonstrate 5xSTS of 10sec or less to demonstrate age matched BLE norms needed for funcitonal transfers    Baseline 10/07/21 unable to complete one STS without UE use    Time 8    Period Weeks    Status New    Target Date 12/02/21      PT LONG TERM GOAL #2   Title pnt will be able to negotiate 4 steps with 0/10 pain on NPS to increase community navigation    Baseline 10/07/21 limited to 1 step with subjective increased pain    Time 8    Period Weeks    Status New    Target Date 12/02/21      PT LONG TERM GOAL #3   Title Pnt will be able to stand for 30 min  with less than 4/10  pain on NPS in order to cook at home    Baseline 10/07/21 cannot stand for more than 5 min without having to rest due to pain      PT LONG TERM GOAL #4   Title Patient will increase FOTO score to 60 to demonstrate predicted increase in functional mobility to complete ADLs    Baseline 10/07/21 48    Time 8    Period Weeks    Status New                   Plan - 11/17/21 1045     Clinical Impression Statement Patient arrives to session >15 minutes late. Redirecting required throughout session as pt provided detailed information regarding pain and recent activities. 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 The 3 tests that were positive, pt was very pain sensitive to (listed above). Possible contribution of pain by SIJ. Interventions limited by tardiness and pain. Patient will continue to benefit from skilled physical therapy to address pain and deficits in strength for improved QOL and ability to care for her family.    Personal Factors and Comorbidities Fitness;Time since onset of injury/illness/exacerbation;Comorbidity 1    Comorbidities BMI    Examination-Activity Limitations Carry;Lift;Sit;Stand;Bed Mobility;Locomotion Level;Sleep;Bend;Squat;Transfers;Stairs    Examination-Participation Restrictions Cleaning;Yard Work;Community Activity;Laundry;Occupation    Stability/Clinical Decision Making Evolving/Moderate complexity    Rehab Potential Good    PT Frequency 2x / week    PT Duration 8 weeks    PT Treatment/Interventions ADLs/Self Care Home Management;Biofeedback;Cryotherapy;Electrical Stimulation;Iontophoresis 4mg /ml Dexamethasone;Moist Heat;Traction;Ultrasound;Gait training;Stair training;Functional mobility training;Therapeutic activities;Therapeutic exercise;Balance training;Neuromuscular re-education;Patient/family education;Manual techniques;Manual lymph drainage;Compression bandaging;Passive range of motion;Dry needling;Energy  conservation;Taping;Splinting;Vestibular;Spinal Manipulations;Joint Manipulations    PT Next Visit Plan SIJ mobility, hip MMT/5xSTS, beighton scale, lumbar sacrum mobility, lumbar redic tests, stair assessment    PT Home Exercise Plan MedBridge Access Code:    Consulted and Agree with Plan of Care Patient             Patient will benefit from skilled therapeutic intervention in order to improve the following deficits and impairments:  Abnormal gait, Decreased activity tolerance, Decreased endurance, Decreased strength, Dizziness, Hypomobility, Increased fascial restricitons, Impaired sensation, Improper body mechanics, Pain, Decreased mobility, Difficulty walking, Impaired tone, Postural dysfunction  Visit Diagnosis: Chronic bilateral low back pain with right-sided sciatica     Problem List Patient Active Problem List   Diagnosis Date Noted   Dizzy spells 08/26/2021   Lumbar back pain with radiculopathy affecting right lower extremity 08/26/2021   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   Dysthymia 08/26/2021   Severe obesity (BMI >= 40) (HCC) 07/20/2017   Iron deficiency anemia 08/02/2013   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   GERD (gastroesophageal reflux disease) 04/17/2013    06/17/2013 PT, DPT    [facility] [address], [place] Phone: [phone]   Fax:  [phone]  Name: ZISSEL BIEDERMAN MRN: Maggie Font Date of Birth: 1980-12-05

## 2021-11-18 ENCOUNTER — Telehealth: Payer: Self-pay

## 2021-11-18 NOTE — Telephone Encounter (Signed)
Pt is scheduled for Tuesday 11/24/21 with Dr Caralee Ates ?

## 2021-11-18 NOTE — Telephone Encounter (Signed)
Received paperwork via fax requesting PCS services. Patient will need to be seen in office to discuss and get forms completed. She has seen Junie Panning and Dr. Rosana Berger has also been involved in her care. She cancelled her last follow up with Dr Rosana Berger and also had an acute visit with Dr. Ancil Boozer that she cancelled as well. Please let her know she has to personally be seen in the office by a Provider for these forms to be completed. Thanks in advance!  ?

## 2021-11-20 ENCOUNTER — Ambulatory Visit: Payer: Medicaid Other | Admitting: Physical Therapy

## 2021-11-23 ENCOUNTER — Ambulatory Visit: Payer: Medicaid Other | Admitting: Physical Therapy

## 2021-11-23 NOTE — Patient Instructions (Incomplete)
**  LOW BACK PAIN with N/T RIGHT LEG** ?Extension feels good  ?  ?  ?INTERVENTIONS  ?  ?Manual Therapy  ?Hamstring stretch RLE, 2x60 seconds ?Sciatic nerve flossing RLE, 2x10 reps ?Piriformis stretch RLE, 2 x30 seconds  ?  ?  ?SIJ Testing: tested positive in 3 out of 5 ? -Very sensitive to pain in the following: distraction, compression and sacral thrust. ?  ?  ?Therapeutic Exercise ?NuStep LE only for warmup, level 3, 5 minutes; ?Sidelying hip abduction RLE BTB 3x10 ?  ?  ?MedBridge - re-printed and provided via text  ?Access Code: [email or web address] ?  ?  ?  ?Clinical Impression: Patient arrives to session >15 minutes late. Redirecting required throughout session as pt provided detailed information regarding pain and recent activities. 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 The 3 tests that were positive, pt was very pain sensitive to (listed above). Possible contribution of pain by SIJ. Interventions limited by tardiness and pain. Patient will continue to benefit from skilled physical therapy to address pain and deficits in strength for improved QOL and ability to care for her family.  ?

## 2021-11-24 ENCOUNTER — Ambulatory Visit: Payer: Medicaid Other | Admitting: Internal Medicine

## 2021-11-24 NOTE — Progress Notes (Deleted)
? ?Established Patient Office Visit ? ?Subjective:  ?Patient ID: Yesenia Thomas, female    DOB: 29-Aug-1981  Age: 41 y.o. MRN: [id number] ? ?CC: No chief complaint on file. ? ? ?HPI ?Yesenia Thomas presents for paperwork.  ? ?Past Medical History:  ?Diagnosis Date  ? ADHD   ? Anxiety   ? Chlamydia   ? Depression   ? GERD (gastroesophageal reflux disease)   ? Iron deficiency anemia   ? Morbid obesity due to excess calories ([place])   ? PPD positive   ? Pregnancy induced hypertension   ? Trauma   ? Varicella   ? ? ?Past Surgical History:  ?Procedure Laterality Date  ? CESAREAN SECTION    ? ? ?Family History  ?Problem Relation Age of Onset  ? Hypertension Mother   ? Thrombosis Maternal Grandmother   ? Clotting disorder Maternal Grandfather   ? Deep vein thrombosis Maternal Grandfather   ? Heart attack Paternal Grandmother   ? ? ?Social History  ? ?Socioeconomic History  ? Marital status: Unknown  ?  Spouse name: Not on file  ? Number of children: Not on file  ? Years of education: Not on file  ? Highest education level: Not on file  ?Occupational History  ? Not on file  ?Tobacco Use  ? Smoking status: Never  ? Smokeless tobacco: Never  ?Vaping Use  ? Vaping Use: Never used  ?Substance and Sexual Activity  ? Alcohol use: Not Currently  ? Drug use: Not Currently  ?  Types: Marijuana  ? Sexual activity: Yes  ?Other Topics Concern  ? Not on file  ?Social History Narrative  ? Not on file  ? ?Social Determinants of Health  ? ?Financial Resource Strain: Not on file  ?Food Insecurity: Not on file  ?Transportation Needs: Not on file  ?Physical Activity: Not on file  ?Stress: Not on file  ?Social Connections: Not on file  ?Intimate Partner Violence: Not on file  ? ? ?Outpatient Medications Prior to Visit  ?Medication Sig Dispense Refill  ? Accu-Chek Softclix Lancets lancets Use as instructed 100 each 12  ? Acetaminophen 500 MG coapsule Take by mouth as needed.    ? blood glucose meter kit and supplies KIT Dispense based  on patient and insurance preference. Use up to four times daily as directed. 1 each 0  ? famotidine (PEPCID) 20 MG tablet Take 20 mg by mouth 2 (two) times daily as needed for heartburn or indigestion.    ? ferrous gluconate (FERGON) 324 MG tablet Take 1 tablet (324 mg total) by mouth daily. 90 tablet 3  ? glucose blood test strip Use as instructed 100 each 12  ? ibuprofen (ADVIL) 200 MG tablet Take 200 mg by mouth every 6 (six) hours as needed.    ? ?No facility-administered medications prior to visit.  ? ? ?Allergies  ?Allergen Reactions  ? Lactose Intolerance (Gi)   ? Percocet [Oxycodone-Acetaminophen]   ? ? ?ROS ?Review of Systems ? ?  ?Objective:  ?  ?Physical Exam ? ?There were no vitals taken for this visit. ?Wt Readings from Last 3 Encounters:  ?09/28/21 (!) 370 lb 12.8 oz (168.2 kg)  ?09/16/21 (!) 372 lb 6.4 oz (168.9 kg)  ?09/11/21 (!) 368 lb (166.9 kg)  ? ? ? ?Health Maintenance Due  ?Topic Date Due  ? COVID-19 Vaccine (1) Never done  ? PAP SMEAR-Modifier  Never done  ? ? ?There are no preventive care reminders to display for this  patient. ? ?Lab Results  ?Component Value Date  ? TSH 1.58 09/16/2021  ? ?Lab Results  ?Component Value Date  ? WBC 9.2 09/16/2021  ? HGB 11.0 (L) 09/16/2021  ? HCT 35.9 09/16/2021  ? MCV 77.2 (L) 09/16/2021  ? PLT 318 09/16/2021  ? ?Lab Results  ?Component Value Date  ? NA 142 09/16/2021  ? K 3.7 09/16/2021  ? CO2 28 09/16/2021  ? GLUCOSE 99 09/16/2021  ? BUN 9 09/16/2021  ? CREATININE 0.74 09/16/2021  ? BILITOT 0.3 09/16/2021  ? ALKPHOS 59 02/28/2018  ? AST 14 09/16/2021  ? ALT 14 09/16/2021  ? PROT 5.5 (L) 09/16/2021  ? ALBUMIN 3.7 02/28/2018  ? CALCIUM 8.7 09/16/2021  ? ANIONGAP 9 02/28/2018  ? EGFR 105 09/16/2021  ? ?No results found for: CHOL ?No results found for: HDL ?No results found for: [place] ?No results found for: TRIG ?No results found for: CHOLHDL ?Lab Results  ?Component Value Date  ? HGBA1C 5.6 09/16/2021  ? ? ?  ?Assessment & Plan:  ? ?Problem List Items  Addressed This Visit   ?None ? ? ?No orders of the defined types were placed in this encounter. ? ? ?Follow-up: No follow-ups on file.  ? ? ?Teodora Medici, DO ?

## 2021-11-27 ENCOUNTER — Other Ambulatory Visit: Payer: Self-pay

## 2021-11-27 ENCOUNTER — Encounter: Payer: Self-pay | Admitting: Physical Therapy

## 2021-11-27 ENCOUNTER — Ambulatory Visit: Payer: Medicaid Other

## 2021-11-27 DIAGNOSIS — M5441 Lumbago with sciatica, right side: Secondary | ICD-10-CM | POA: Diagnosis not present

## 2021-11-27 DIAGNOSIS — G8929 Other chronic pain: Secondary | ICD-10-CM

## 2021-11-27 NOTE — Therapy (Signed)
[place] [facility] [address], [place], [address] Phone: [phone]   Fax:  [phone]  Physical Therapy Treatment  Patient Details  Name: Yesenia Thomas MRN: [id number] Date of Birth: June 21, 1981 Referring Provider (PT): Denny Peon Mecum   Encounter Date: 11/27/2021   PT End of Session - 11/27/21 0814     Visit Number 5    Number of Visits 17    Date for PT Re-Evaluation 12/04/21    Progress Note Due on Visit 10    PT Start Time 0800    PT Stop Time 0830    PT Time Calculation (min) 30 min    Activity Tolerance Patient tolerated treatment well    Behavior During Therapy [facility] for tasks assessed/performed             Past Medical History:  Diagnosis Date   ADHD    Anxiety    Chlamydia    Depression    GERD (gastroesophageal reflux disease)    Iron deficiency anemia    Morbid obesity due to excess calories ([facility])    PPD positive    Pregnancy induced hypertension    Trauma    Varicella     Past Surgical History:  Procedure Laterality Date   CESAREAN SECTION      There were no vitals filed for this visit.   Subjective Assessment - 11/27/21 0802     Subjective Pt reports more so B hip pain today compared to low back. Took ibuprofen this morning. 6/10 NPS in R > L hip.    Pertinent History 41 y.o. female pnt presents with chronic low back pain and N/T that travels down right leg with insidious onset. Pnt has been dealing with sx for approx. 5 years. Sx include "dull" pain in her low back and sacrum and "burining" pain going down her right leg. Pain on NPS at worst is 8/10, best 0/10, and currently 0/10. Aggrevating factors include standing >5 min, negotiating >1 steps, sitting for >5 min., and transfers while sleeping in bed. Pnt states these impairments have lead to decreased participation in cooking, cleaning, and walking to lose weight. Pnt currently works at home and reports pain with sitting to  complete occupational duties. Easing factors include heat packs, NSAIDs, and lumbar extension. Pnt reports pain gets worse throughout the day. PMH includes multiple pregnancies, hx of lowback pain, and a fall in 2014. 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 Pnt goals include "getting to the root of her pain" and decreasing daily pain that limits functional ADLs. Pnt denys significant weight change, B/B dysfunction, nausea/vomitting, fever, and night pain    Limitations Sitting;Standing;Walking;House hold activities;Lifting    How long can you sit comfortably? 5 min    How long can you stand comfortably? 5 min    How long can you walk comfortably? 5 min    Diagnostic tests X-ray on bilateral hips in June 2022- DJD    Patient Stated Goals "getting to the root of her pain" and decreasing daily pain that limits functional ADLs    Currently in Pain? Yes    Pain Score 6     Pain Location Hip    Pain Descriptors / Indicators Aching;Dull    Pain Onset More than a month ago             Manual therapy in supine: 15 min for pain modulation:   R hamstring stretch: 4x30  sec   Hip flexion/adduction for lateral glut stretch: 4x30 sec  R hip manual distraction: 5x10 sec bouts to reduce joint compression    There.ex:  Hook lying lumbar trunk rotations for lateral hip stretch and lumbar mobility: 2x20   Hook lying glut bridge with swiss ball isometric squeeze: 3x15   Hook lying diaphragmatic breathing: 8 min with multimodal cuing for form/technique with use of hands on diaphragm for tactile cuing for feeling diaphragm contraction and relaxation and pelvic floor activation for coordinating inhalation and exhalation. Good understanding of exercise after education and performance.   Hook lying adduction ball squeezes: x10, 5 sec holds with attempts to coordinate diaphragmatic breathing with isometric exercise.      PT Education - 11/27/21 0804     Education Details  diaphgramtic breathing and form/technique with exercise.    Person(s) Educated Patient    Methods Explanation;Demonstration;Tactile cues;Verbal cues    Comprehension Verbalized understanding;Returned demonstration              PT Short Term Goals - 10/07/21 1023       PT SHORT TERM GOAL #1   Title Pnt will decrease max NPS from 8/10 to 6/10 to improve ability to participate in community ADLs    Baseline 10/07/21 worst NPS 8/10    Time 4    Period Weeks    Status New    Target Date 11/04/21      PT SHORT TERM GOAL #2   Title pnt will have 0/10 pain while sitting at desk for work for at least 30 min. to complete occupational ADLs    Baseline 10/07/21 limited to 5 min    Time 4    Period Weeks    Status New    Target Date 11/04/21      PT SHORT TERM GOAL #3   Title pnt will be able to perform bed mobilty with 6/10 pain on NPS to improve quality of sleep    Baseline 10/07/21 NPS 8/10    Time 4    Period Weeks    Status New    Target Date 11/04/21               PT Long Term Goals - 10/07/21 1029       PT LONG TERM GOAL #1   Title Pt will demonstrate 5xSTS of 10sec or less to demonstrate age matched BLE norms needed for funcitonal transfers    Baseline 10/07/21 unable to complete one STS without UE use    Time 8    Period Weeks    Status New    Target Date 12/02/21      PT LONG TERM GOAL #2   Title pnt will be able to negotiate 4 steps with 0/10 pain on NPS to increase community navigation    Baseline 10/07/21 limited to 1 step with subjective increased pain    Time 8    Period Weeks    Status New    Target Date 12/02/21      PT LONG TERM GOAL #3   Title Pnt will be able to stand for 30 min with less than 4/10 pain on NPS in order to cook at home    Baseline 10/07/21 cannot stand for more than 5 min without having to rest due to pain      PT LONG TERM GOAL #4   Title Patient will increase FOTO score to 60 to demonstrate predicted increase in functional  mobility to complete ADLs  Baseline 10/07/21 48    Time 8    Period Weeks    Status New                   Plan - 11/27/21 0814     Clinical Impression Statement Pt late to session again. Limited session overall due to tardiness. Heavy focus on hip mobilty, diphragmatic breathing and core/hip stability with good tolerance to exercises. pt did require heavy education and multimodal cuing for breathing exercises and would benefit from future education and performance for improving deep coore activation and possible pelvic floor invovlement for LBP due to reports of urinary frequency and urgency . Pain post session recorded at 4/10 NPS. Pt will continue to benefit from skillled PT services to further decrease pain and improve strength for improved QoL.    Personal Factors and Comorbidities Fitness;Time since onset of injury/illness/exacerbation;Comorbidity 1    Comorbidities BMI    Examination-Activity Limitations Carry;Lift;Sit;Stand;Bed Mobility;Locomotion Level;Sleep;Bend;Squat;Transfers;Stairs    Examination-Participation Restrictions Cleaning;Yard Work;Community Activity;Laundry;Occupation    Stability/Clinical Decision Making Evolving/Moderate complexity    Rehab Potential Good    PT Frequency 2x / week    PT Duration 8 weeks    PT Treatment/Interventions ADLs/Self Care Home Management;Biofeedback;Cryotherapy;Electrical Stimulation;Iontophoresis 4mg /ml Dexamethasone;Moist Heat;Traction;Ultrasound;Gait training;Stair training;Functional mobility training;Therapeutic activities;Therapeutic exercise;Balance training;Neuromuscular re-education;Patient/family education;Manual techniques;Manual lymph drainage;Compression bandaging;Passive range of motion;Dry needling;Energy conservation;Taping;Splinting;Vestibular;Spinal Manipulations;Joint Manipulations    PT Next Visit Plan SIJ mobility, hip MMT/5xSTS, beighton scale, lumbar sacrum mobility, lumbar redic tests, stair assessment    PT  Home Exercise Plan MedBridge Access Code: [email or web address]    Consulted and Agree with Plan of Care Patient             Patient will benefit from skilled therapeutic intervention in order to improve the following deficits and impairments:  Abnormal gait, Decreased activity tolerance, Decreased endurance, Decreased strength, Dizziness, Hypomobility, Increased fascial restricitons, Impaired sensation, Improper body mechanics, Pain, Decreased mobility, Difficulty walking, Impaired tone, Postural dysfunction  Visit Diagnosis: Chronic bilateral low back pain with right-sided sciatica     Problem List Patient Active Problem List   Diagnosis Date Noted   Dizzy spells 08/26/2021   Lumbar back pain with radiculopathy affecting right lower extremity 08/26/2021   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   Dysthymia 08/26/2021   Severe obesity (BMI >= 40) (HCC) 07/20/2017   Iron deficiency anemia 08/02/2013   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   GERD (gastroesophageal reflux disease) 04/17/2013    Delphia Grates. Fairly IV, PT, DPT Physical Therapist- [place]  [facility]  11/27/2021, 8:34 AM  [place] [facility] PHYSICAL AND SPORTS MEDICINE [address], [place], [address] Phone: [phone]   Fax:  [phone]  Name: KHYLIN GUYOT MRN: [id number] Date of Birth: [birth year]

## 2021-11-30 ENCOUNTER — Ambulatory Visit: Payer: Medicaid Other

## 2021-11-30 ENCOUNTER — Other Ambulatory Visit: Payer: Self-pay

## 2021-11-30 DIAGNOSIS — M5441 Lumbago with sciatica, right side: Secondary | ICD-10-CM | POA: Diagnosis not present

## 2021-11-30 DIAGNOSIS — G8929 Other chronic pain: Secondary | ICD-10-CM

## 2021-11-30 NOTE — Therapy (Signed)
[place] [facility] [facility]OR[facility]IC[facility] ?[address] ?[place], [place], [address] ?Phone: [phone]   Fax:  [phone] ? ?Physical Therapy Treatment/Reassessment  ? ?Patient Details  ?Name: Yesenia Thomas ?MRN: [id number] ?Date of Birth: [birth year] ?Referring Provider (PT): Erin Mecum ? ? ?Encounter Date: 11/30/2021 ? ? PT End of Session - 11/30/21 0950   ? ? Visit Number 6   ? Number of Visits 17   ? Date for PT Re-Evaluation 12/06/21   ? Authorization Type UHC  Medicaid Prepaid Auth; 27 annual visits PT/OT/ST   ? Authorization Time Period 10/07/21-12/06/21   ? Authorization - Visit Number 5   ? Authorization - Number of Visits 27   ? Progress Note Due on Visit 10   ? PT Start Time 0840   ? PT Stop Time 0910   ? PT Time Calculation (min) 30 min   ? Activity Tolerance Patient tolerated treatment well   ? Behavior During Therapy [facility] for tasks assessed/performed   ? ?  ?  ? ?  ? ? ?Past Medical History:  ?Diagnosis Date  ? ADHD   ? Anxiety   ? Chlamydia   ? Depression   ? GERD (gastroesophageal reflux disease)   ? Iron deficiency anemia   ? Morbid obesity due to excess calories ([place])   ? PPD positive   ? Pregnancy induced hypertension   ? Trauma   ? Varicella   ? ? ?Past Surgical History:  ?Procedure Laterality Date  ? CESAREAN SECTION    ? ? ?There were no vitals filed for this visit. ? ? Subjective Assessment - 11/30/21 0843   ? ? Subjective Pt reports having some increased pain today after a busy weekend, attended 2 birthday parties, including a trampoline park as well as helping her son get on/of monkey bars. Pain is around and 8/10 at present. Pt reports she conitnues to work on her HEP without difficult with favorable effect. Pt reports her Rt foot still goes numb occasionally, but is improved with certain sleeping postures.   ? Pertinent History 41 y.o. female pnt presents with chronic low back pain and N/T that travels down right leg with insidious onset. Pnt has been  dealing with sx for approx. 5 years. Sx include "dull" pain in her low back and sacrum and "burining" pain going down her right leg. Pain on NPS at worst is 8/10, best 0/10, and currently 0/10. Aggrevating factors include standing >5 min, negotiating >1 steps, sitting for >5 min., and transfers while sleeping in bed. Pnt states these impairments have lead to decreased participation in cooking, cleaning, and walking to lose weight. Pnt currently works at home and reports pain with sitting to complete occupational duties. Easing factors include heat packs, NSAIDs, and lumbar extension. Pnt reports pain gets worse throughout the day. PMH includes multiple pregnancies, hx of lowback pain, and a fall in 2014. 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 Pnt goals include "getting to the root of her pain" and decreasing daily pain that limits functional ADLs. Pnt denys significant weight change, B/B dysfunction, nausea/vomitting, fever, and night pain   ? How long can you sit comfortably? 5 min at eval, 5 minutes today   ? How long can you stand comfortably? 5 min at eval, 5 minutes today   ? How long can you walk comfortably? 5 min at eval, 30 minutes today   ? Diagnostic tests X-ray on bilateral hips  in June 2022- DJD   ? Patient Stated Goals "getting to the root of her pain" and decreasing daily pain that limits functional ADLs   ? Currently in Pain? Yes   ? Pain Score 8    ? Pain Location Back   Rt pelvis, Rt hip, low back  ? ?  ?  ? ?  ? ? ? ? ? [facility] PT Assessment - 11/30/21 0001   ? ?  ? Assessment  ? Medical Diagnosis Lumbar rediculopathy   ? Referring Provider (PT) Junie Panning Mecum   ? Onset Date/Surgical Date 10/07/16   ? Hand Dominance Right   ? Next MD Visit none   ? Prior Therapy none   ? ?  ?  ? ?  ? ?5xSTS: 16.96sec hands on thighs ?FOTO [place] 37 (53 at eval)  ? ? ? ? PT Education - 11/30/21 0950   ? ? Education Details importance of regular attendance for best response to PT   ? Person(s)  Educated Patient   ? Methods Explanation   ? Comprehension Verbalized understanding   ? ?  ?  ? ?  ? ? ? PT Short Term Goals - 11/30/21 0857   ? ?  ? PT SHORT TERM GOAL #1  ? Title Pnt will decrease max NPS from 8/10 to 6/10 to improve ability to participate in community ADLs   ? Baseline 10/07/21 worst NPS 8/10; 11/30/21 8/10 today, on average pain still is higher than 6/10   ? Time 4   ? Period Weeks   ? Status Not Met   ? Target Date 11/04/21   ?  ? PT SHORT TERM GOAL #2  ? Title Pt will have 0/10 pain while sitting at desk for work for at least 30 min to complete occupational ADLs   ? Baseline 10/07/21 limited to 5 min; 11/30/21: Pt sits at in bed for work duties   ? Time 4   ? Period Weeks   ? Status Not Met   ? Target Date 11/04/21   ?  ? PT SHORT TERM GOAL #3  ? Title Pt will be able to perform bed mobilty with 6/10 pain on NPS to improve quality of sleep; 11/30/21 pain still getting as high as 8/10 on average   ? Baseline 10/07/21 NPS 8/10;   ? Time 4   ? Period Weeks   ? Status Not Met   ? Target Date 11/04/21   ? ?  ?  ? ?  ? ? ? ? PT Long Term Goals - 11/30/21 0900   ? ?  ? PT LONG TERM GOAL #1  ? Title Pt will demonstrate 5xSTS of 10sec or less to demonstrate age matched BLE norms needed for funcitonal transfers   ? Baseline 10/07/21 unable to complete one STS without UE use;  11/30/21: 16.96sec hands on thighs   ? Time 8   ? Period Weeks   ? Status Not Met   ? Target Date 12/06/21   ?  ? PT LONG TERM GOAL #2  ? Title Pt will be able to negotiate 4 steps with 0/10 pain on NPS to increase community navigation.   ? Baseline 10/07/21 limited to 1 step with subjective increased pain; 3/20: performs 4 stairs without exacerbaton of pain, but has some pain after performing 8 stairs.   ? Time 8   ? Period Weeks   ? Status Achieved   ? Target Date 12/06/21   ?  ? PT  LONG TERM GOAL #3  ? Title Pt will be able to stand for 30 min with less than 4/10 pain on NPS in order to cook at home.   ? Baseline 10/07/21 cannot stand for  more than 5 min without having to rest due to pain; 11/30/21: still uses a chair in kitchen when cooking.   ? Time 8   ? Period Weeks   ? Status Not Met   ? Target Date 12/06/21   ?  ? PT LONG TERM GOAL #4  ? Title Patient will increase FOTO score to 60 to demonstrate predicted increase in functional mobility to complete ADLs   ? Baseline 10/07/21: 53 at evaluation; 11/30/21: 37   ? Time 8   ? Period Weeks   ? Status Not Met   ? Target Date 12/06/21   ? ?  ?  ? ?  ? ? ? ? ? ? ? ? Plan - 11/30/21 0952   ? ? Clinical Impression Statement Pt steadily approaching end of initital certi period with insurance, reassessment performed this date. FOTO score indicates a decline in ability to perform basic mobility for ADL/IADL Goals assessment shows no significant improvement, however pt is able to perfrom 5xSTS hands-free. Pt reports no improvements in the intensity of her pain, nor tolerance to other activities aside from walking which she reports is no longer limited. Unfortuantely pt has not been able to make many of her scheduled visits over this 8 week period which no doubt is contributing to some limitations in progression. Pt would like to conitnue to work toward more PT to improve her pain and function. Pt will be recerted for 4 weeks to continue forward. Pt reminded of her high number of npo-shows in reference to clinic policy, explained that another no-show would result in DC from our services.   ? Personal Factors and Comorbidities Fitness;Time since onset of injury/illness/exacerbation;Comorbidity 1   ? Comorbidities BMI   ? Examination-Activity Limitations Carry;Lift;Sit;Stand;Bed Mobility;Locomotion Level;Sleep;Bend;Squat;Transfers;Stairs   ? Examination-Participation Restrictions Cleaning;Yard Work;Community Activity;Laundry;Occupation   ? Stability/Clinical Decision Making Stable/Uncomplicated   ? Clinical Decision Making Moderate   ? Rehab Potential Poor   ? PT Frequency 2x / week   ? PT Duration 8 weeks   ? PT  Treatment/Interventions ADLs/Self Care Home Management;Biofeedback;Cryotherapy;Electrical Stimulation;Iontophoresis 36m/ml Dexamethasone;Moist Heat;Traction;Ultrasound;Gait training;Stair training;F

## 2021-12-01 ENCOUNTER — Telehealth: Payer: Self-pay

## 2021-12-01 ENCOUNTER — Ambulatory Visit: Payer: Medicaid Other | Admitting: Internal Medicine

## 2021-12-01 ENCOUNTER — Encounter: Payer: Self-pay | Admitting: Internal Medicine

## 2021-12-01 VITALS — BP 130/86 | HR 113 | Temp 97.6°F | Resp 16 | Ht 66.0 in | Wt 380.4 lb

## 2021-12-01 DIAGNOSIS — Z1231 Encounter for screening mammogram for malignant neoplasm of breast: Secondary | ICD-10-CM | POA: Diagnosis not present

## 2021-12-01 DIAGNOSIS — M25551 Pain in right hip: Secondary | ICD-10-CM | POA: Diagnosis not present

## 2021-12-01 MED ORDER — IBUPROFEN 800 MG PO TABS: 800.0000 mg | ORAL_TABLET | Freq: Three times a day (TID) | ORAL | 3 refills | Status: DC | PRN

## 2021-12-01 NOTE — Telephone Encounter (Signed)
Copied from [place] [id number]. Topic: General - Other ?>> Nov 30, 2021  4:38 PM Pawlus, Brayton Layman A wrote: ?Reason for CRM: Pt wanted a request to be sent to see if the pt could have ibuprofen (ADVIL) sent in without being seen, pt made an appt for 3/21 but wanted to know if she needs to keep this appt to get his Rx. ?

## 2021-12-01 NOTE — Progress Notes (Signed)
? ?Acute Office Visit ? ?Subjective:  ? ? Patient ID: Yesenia Thomas, female    DOB: 1981-03-23, 41 y.o.   MRN: [id number] ? ?Chief Complaint  ?Patient presents with  ? Hip Pain  ?  Wants ibuprofen 878m  ? ? ?HPI ?Patient is in today for hip pain. Chronic lumbar pain for 10 years, hip pain after giving birth in 02/2014. Cannot stand up for long periods time without feeling like going to pass out. Symptoms take 10 minutes after standing or exertion. Physical therapy is helping when she is doing it but she still has severe pain afterwards. Pain feels like burning, electric pain.  ? ?HIP PAIN ?Duration:  6 years ?Involved hip: right  ?Mechanism of injury: unknown ?Location: diffuse ?Onset: gradual  ?Severity: 10/10  ?Quality: aching, burning, and throbbing ?Frequency: intermittent ?Radiation: yes ?Aggravating factors: weight bearing, walking, and movement   ?Alleviating factors: physical therapy, NSAIDs, and rest  ?Status: fluctuating ?Treatments attempted: rest, ibuprofen, and physical therapy   ?Relief with NSAIDs?: mild ?Weakness with weight bearing: yes ?Weakness with walking: yes ?Paresthesias / decreased sensation: no ?Swelling: no ?Redness:no ?Fevers: no ? ? ?Past Medical History:  ?Diagnosis Date  ? ADHD   ? Anxiety   ? Chlamydia   ? Depression   ? GERD (gastroesophageal reflux disease)   ? Iron deficiency anemia   ? Morbid obesity due to excess calories ([place]   ? PPD positive   ? Pregnancy induced hypertension   ? Trauma   ? Varicella   ? ? ?Past Surgical History:  ?Procedure Laterality Date  ? CESAREAN SECTION    ? ? ?Family History  ?Problem Relation Age of Onset  ? Hypertension Mother   ? Thrombosis Maternal Grandmother   ? Clotting disorder Maternal Grandfather   ? Deep vein thrombosis Maternal Grandfather   ? Heart attack Paternal Grandmother   ? ? ?Social History  ? ?Socioeconomic History  ? Marital status: Unknown  ?  Spouse name: Not on file  ? Number of children: Not on file  ? Years of  education: Not on file  ? Highest education level: Not on file  ?Occupational History  ? Not on file  ?Tobacco Use  ? Smoking status: Never  ? Smokeless tobacco: Never  ?Vaping Use  ? Vaping Use: Never used  ?Substance and Sexual Activity  ? Alcohol use: Not Currently  ? Drug use: Not Currently  ?  Types: Marijuana  ? Sexual activity: Yes  ?Other Topics Concern  ? Not on file  ?Social History Narrative  ? Not on file  ? ?Social Determinants of Health  ? ?Financial Resource Strain: Not on file  ?Food Insecurity: Not on file  ?Transportation Needs: Not on file  ?Physical Activity: Not on file  ?Stress: Not on file  ?Social Connections: Not on file  ?Intimate Partner Violence: Not on file  ? ? ?Outpatient Medications Prior to Visit  ?Medication Sig Dispense Refill  ? Accu-Chek Softclix Lancets lancets Use as instructed 100 each 12  ? Acetaminophen 500 MG coapsule Take by mouth as needed.    ? blood glucose meter kit and supplies KIT Dispense based on patient and insurance preference. Use up to four times daily as directed. 1 each 0  ? famotidine (PEPCID) 20 MG tablet Take 20 mg by mouth 2 (two) times daily as needed for heartburn or indigestion.    ? ferrous gluconate (FERGON) 324 MG tablet Take 1 tablet (324 mg total) by mouth daily. 9Refugio  tablet 3  ? glucose blood test strip Use as instructed 100 each 12  ? ibuprofen (ADVIL) 200 MG tablet Take 200 mg by mouth every 6 (six) hours as needed.    ? ?No facility-administered medications prior to visit.  ? ? ?Allergies  ?Allergen Reactions  ? Lactose Intolerance (Gi)   ? Percocet [Oxycodone-Acetaminophen]   ? ? ?Review of Systems  ?Constitutional:  Negative for chills and fever.  ?Musculoskeletal:  Positive for arthralgias and back pain. Negative for joint swelling.  ?Skin: Negative.   ? ?   ?Objective:  ?  ?Physical Exam ?Constitutional:   ?   Appearance: Normal appearance. She is obese.  ?HENT:  ?   Head: Normocephalic and atraumatic.  ?Eyes:  ?   Conjunctiva/sclera:  Conjunctivae normal.  ?Cardiovascular:  ?   Rate and Rhythm: Normal rate and regular rhythm.  ?Pulmonary:  ?   Effort: Pulmonary effort is normal.  ?   Breath sounds: Normal breath sounds.  ?Musculoskeletal:     ?   General: Tenderness present. No swelling. Normal range of motion.  ?   Right lower leg: No edema.  ?   Left lower leg: No edema.  ?   Comments: Full range of motion of bilateral hips, lumbar spine. Pain generalized in hip, radiates down side of leg. Straight leg test negative b/l.   ?Skin: ?   General: Skin is warm and dry.  ?Neurological:  ?   General: No focal deficit present.  ?   Mental Status: She is alert. Mental status is at baseline.  ?   Sensory: No sensory deficit.  ?   Motor: No weakness.  ?   Gait: Gait normal.  ?Psychiatric:     ?   Mood and Affect: Mood normal.     ?   Behavior: Behavior normal.  ? ? ?BP 130/86   Pulse (!) 113   Temp 97.6 ?F (36.4 ?C)   Resp 16   Ht _0  (1.676 m)   Wt (!) 380 lb 6.4 oz (172.5 kg)   SpO2 98%   BMI 61.40 kg/m?  ?Wt Readings from Last 3 Encounters:  ?09/28/21 (!) 370 lb 12.8 oz (168.2 kg)  ?09/16/21 (!) 372 lb 6.4 oz (168.9 kg)  ?09/11/21 (!) 368 lb (166.9 kg)  ? ? ?Health Maintenance Due  ?Topic Date Due  ? COVID-19 Vaccine (1) Never done  ? PAP SMEAR-Modifier  Never done  ? ? ?There are no preventive care reminders to display for this patient. ? ? ?Lab Results  ?Component Value Date  ? TSH 1.58 09/16/2021  ? ?Lab Results  ?Component Value Date  ? WBC 9.2 09/16/2021  ? HGB 11.0 (L) 09/16/2021  ? HCT 35.9 09/16/2021  ? MCV 77.2 (L) 09/16/2021  ? PLT 318 09/16/2021  ? ?Lab Results  ?Component Value Date  ? NA 142 09/16/2021  ? K 3.7 09/16/2021  ? CO2 28 09/16/2021  ? GLUCOSE 99 09/16/2021  ? BUN 9 09/16/2021  ? CREATININE 0.74 09/16/2021  ? BILITOT 0.3 09/16/2021  ? ALKPHOS 59 02/28/2018  ? AST 14 09/16/2021  ? ALT 14 09/16/2021  ? PROT 5.5 (L) 09/16/2021  ? ALBUMIN 3.7 02/28/2018  ? CALCIUM 8.7 09/16/2021  ? ANIONGAP 9 02/28/2018  ? EGFR 105  09/16/2021  ? ?No results found for: CHOL ?No results found for: HDL ?No results found for: [place] ?No results found for: TRIG ?No results found for: CHOLHDL ?Lab Results  ?Component Value Date  ?  HGBA1C 5.6 09/16/2021  ? ? ?   ?Assessment & Plan:  ? ?1. Right hip pain: Reviewed x-rays, will refill Ibuprofen. Physical exam without abnormalities. Discussed obtaining an MRI but nothing on exam to indicate need for this at this time. Is seeing Neurology in June, recommend EMG to better pinpoint location of nerve pain. Continue physical therapy, discussed weight loss.  ? ?- ibuprofen (ADVIL) 800 MG tablet; Take 1 tablet (800 mg total) by mouth every 8 (eight) hours as needed.  Dispense: 30 tablet; Refill: 3 ?- Ambulatory referral to Physical Therapy ? ?2. Encounter for screening mammogram for malignant neoplasm of breast ? ?- MM Digital Screening; Future ? ? ?Teodora Medici, DO ? ?

## 2021-12-01 NOTE — Telephone Encounter (Signed)
Pt called told she could do OTC advil, but she wants rx for 800mg .  I told her she would need appt for any rx and even then I could not guarantee that's what the doctor would give. ?

## 2021-12-01 NOTE — Patient Instructions (Addendum)
It was great seeing you today! ? ?Plan discussed at today's visit: ?-Take medication with food, no other anti-inflammatories ?-Continue with physical therapy ?-Continue to follow up with Neurology  ?-Mammogram ordered  ? ?Follow up in: 5 months  ? ?Take care and let us know if you have any questions or concerns prior to your next visit. ? ?Dr. Rosana Berger ? ?

## 2021-12-04 ENCOUNTER — Ambulatory Visit: Payer: Medicaid Other

## 2021-12-04 ENCOUNTER — Other Ambulatory Visit: Payer: Self-pay

## 2021-12-04 DIAGNOSIS — M5441 Lumbago with sciatica, right side: Secondary | ICD-10-CM | POA: Diagnosis not present

## 2021-12-04 DIAGNOSIS — G8929 Other chronic pain: Secondary | ICD-10-CM

## 2021-12-04 NOTE — Telephone Encounter (Signed)
Anderson with [facility] called in to check status of forms sent over on 11/18/21 for personal care services need to get these back completed ASAP please advise and call Dareen Piano at Ph# [phone] ?

## 2021-12-04 NOTE — Therapy (Signed)
[facility] ?[address] ?[place], [place], [address] ?Phone: [phone]   Fax:  [phone] ? ?Physical Therapy Treatment ? ?Patient Details  ?Name: Yesenia Thomas ?MRN: [id number] ?Date of Birth: [birth year] ?Referring Provider (PT): Erin Mecum ? ? ?Encounter Date: 12/04/2021 ? ? PT End of Session - 12/04/21 0758   ? ? Visit Number 7   ? Number of Visits 17   ? Date for PT Re-Evaluation 12/06/21   ? Authorization Type UHC  Medicaid Prepaid Auth; 27 annual visits PT/OT/ST   ? Authorization Time Period 6/43/32-9/51/88 (will recert with medicaid next visit)   ? Progress Note Due on Visit 10   ? PT Start Time [phone]   ? PT Stop Time 0829   ? PT Time Calculation (min) 38 min   ? Activity Tolerance Patient tolerated treatment well   ? Behavior During Therapy [facility] for tasks assessed/performed   ? ?  ?  ? ?  ? ? ?Past Medical History:  ?Diagnosis Date  ? ADHD   ? Anxiety   ? Chlamydia   ? Depression   ? GERD (gastroesophageal reflux disease)   ? Iron deficiency anemia   ? Morbid obesity due to excess calories ([place])   ? PPD positive   ? Pregnancy induced hypertension   ? Trauma   ? Varicella   ? ? ?Past Surgical History:  ?Procedure Laterality Date  ? CESAREAN SECTION    ? ? ?There were no vitals filed for this visit. ? ? Subjective Assessment - 12/04/21 0752   ? ? Subjective Pt saw MD since last visit to obtain NSAID Rx for ongoing pain. No pain meds today taken prior to arrival, but pain is around 7-8/10 on Rt latera hip.   ? Pertinent History Yesenia Thomas is a 63yoF who is referred to OPPT for chronic ongoing low back pain with referral into legs. Pain has limited ability to stand and walk for >5 minutes, which has impacted ability to perfrom home care, childcare, and selfcare.   ? Currently in Pain? Yes   ? Pain Score 7    ? Pain Location --   right hip/thigh  ? ?  ?  ? ?  ? ? ? ?INTERVENTION THIS DATE: ? ?[employer] ball rollouts 15x3secH, 3x30secH   ?Seated marching 1x20 alternating  ?Seated trunk rotation with ball x20, alternating ?Ball squeeze with overhead lift with blue ball 1x10 ?Seated band clam blue TB 1x20 (magenta ball) ?Seated band reverse clam Yellow 1x20 (magenta ball) ?Seated marching 1x20 alternating  ?Ball squeeze with overhead lift with blue ball 1x10 ?Seated band clam blue TB 1x20 (magenta ball) ?Seated band reverse clam Yellow 1x20 (magenta ball) ? ? ? ? ? ? PT Education - 12/04/21 0806   ? ? Education Details spine coupling with core movements   ? Person(s) Educated Patient   ? Methods Explanation   ? Comprehension Verbalized understanding;Returned demonstration   ? ?  ?  ? ?  ? ? ? PT Short Term Goals - 11/30/21 0857   ? ?  ? PT SHORT TERM GOAL #1  ? Title Pnt will decrease max NPS from 8/10 to 6/10 to improve ability to participate in community ADLs   ? Baseline 10/07/21 worst NPS 8/10; 11/30/21 8/10 today, on average pain still is higher than 6/10   ? Time 4   ? Period Weeks   ? Status Not Met   ? Target Date 11/04/21   ?  ?  PT SHORT TERM GOAL #2  ? Title Pt will have 0/10 pain while sitting at desk for work for at least 30 min to complete occupational ADLs   ? Baseline 10/07/21 limited to 5 min; 11/30/21: Pt sits at in bed for work duties   ? Time 4   ? Period Weeks   ? Status Not Met   ? Target Date 11/04/21   ?  ? PT SHORT TERM GOAL #3  ? Title Pt will be able to perform bed mobilty with 6/10 pain on NPS to improve quality of sleep; 11/30/21 pain still getting as high as 8/10 on average   ? Baseline 10/07/21 NPS 8/10;   ? Time 4   ? Period Weeks   ? Status Not Met   ? Target Date 11/04/21   ? ?  ?  ? ?  ? ? ? ? PT Long Term Goals - 11/30/21 0900   ? ?  ? PT LONG TERM GOAL #1  ? Title Pt will demonstrate 5xSTS of 10sec or less to demonstrate age matched BLE norms needed for funcitonal transfers   ? Baseline 10/07/21 unable to complete one STS without UE use;  11/30/21: 16.96sec hands on thighs   ? Time 8   ? Period Weeks   ? Status Not Met   ?  Target Date 12/06/21   ?  ? PT LONG TERM GOAL #2  ? Title Pt will be able to negotiate 4 steps with 0/10 pain on NPS to increase community navigation.   ? Baseline 10/07/21 limited to 1 step with subjective increased pain; 3/20: performs 4 stairs without exacerbaton of pain, but has some pain after performing 8 stairs.   ? Time 8   ? Period Weeks   ? Status Achieved   ? Target Date 12/06/21   ?  ? PT LONG TERM GOAL #3  ? Title Pt will be able to stand for 30 min with less than 4/10 pain on NPS in order to cook at home.   ? Baseline 10/07/21 cannot stand for more than 5 min without having to rest due to pain; 11/30/21: still uses a chair in kitchen when cooking.   ? Time 8   ? Period Weeks   ? Status Not Met   ? Target Date 12/06/21   ?  ? PT LONG TERM GOAL #4  ? Title Patient will increase FOTO score to 60 to demonstrate predicted increase in functional mobility to complete ADLs   ? Baseline 10/07/21: 53 at evaluation; 11/30/21: 37   ? Time 8   ? Period Weeks   ? Status Not Met   ? Target Date 12/06/21   ? ?  ?  ? ?  ? ? ? ? ? ? ? ? Plan - 12/04/21 0807   ? ? Clinical Impression Statement Opened up with gentle ROM to reduce stiffness in hips and spine. Moved toward volar core strengthening in a spine neutral posture avoiding excessive lordosis. Pt completes session as planned without any pain or symptoms exacerbation. Pt has some aggravation of GERD with forward flexion from last nights dinner.   ? Personal Factors and Comorbidities Fitness;Time since onset of injury/illness/exacerbation;Comorbidity 1   ? Comorbidities BMI   ? Examination-Activity Limitations Carry;Lift;Sit;Stand;Bed Mobility;Locomotion Level;Sleep;Bend;Squat;Transfers;Stairs   ? Examination-Participation Restrictions Cleaning;Yard Work;Community Activity;Laundry;Occupation   ? Stability/Clinical Decision Making Stable/Uncomplicated   ? Clinical Decision Making Moderate   ? Rehab Potential Fair   ? PT Frequency 2x / week   ?  PT Duration 8 weeks   ? PT  Treatment/Interventions ADLs/Self Care Home Management;Biofeedback;Cryotherapy;Electrical Stimulation;Iontophoresis 41m/ml Dexamethasone;Moist Heat;Traction;Ultrasound;Gait training;Stair training;Functional mobility training;Therapeutic activities;Therapeutic exercise;Balance training;Neuromuscular re-education;Patient/family education;Manual techniques;Manual lymph drainage;Compression bandaging;Passive range of motion;Dry needling;Energy conservation;Taping;Splinting;Vestibular;Spinal Manipulations;Joint Manipulations   ? PT Next Visit Plan Resume interventions to target severe weakness in core and loss of postural control. Needs recertification after 32/17/11   ? PT [email or web address] Code: [email or web address]   ? Consulted and Agree with Plan of Care Patient   ? ?  ?  ? ?  ? ? ?Patient will benefit from skilled therapeutic intervention in order to improve the following deficits and impairments:  Abnormal gait, Decreased activity tolerance, Decreased endurance, Decreased strength, Dizziness, Hypomobility, Increased fascial restricitons, Impaired sensation, Improper body mechanics, Pain, Decreased mobility, Difficulty walking, Impaired tone, Postural dysfunction ? ?Visit Diagnosis: ?Chronic bilateral low back pain with right-sided sciatica ? ? ? ? ?Problem List ?Patient Active Problem List  ? Diagnosis Date Noted  ? Dizzy spells 08/26/2021  ? Lumbar back pain with radiculopathy affecting right lower extremity 08/26/2021  ? 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  ? Dysthymia 08/26/2021  ? Severe [place] (BMI >= 40[place] 07/20/2017  ? Iron deficiency anemia 08/02/2013  ? 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  ? GERD (gastroesophageal reflux disease) 04/17/2013  ? ?8:24 AM, 12/04/21 ?AEtta Grandchild PT, DPT ?[place]?[phone](Office) ? ? ?Yesenia Thomas, PT ?12/04/2021, 8:14 AM ? ?[place] [facility] ?[address] ?[place]  [address]?Phone: [phone]  Fax:  [phone]? ?Name: Yesenia Thomas?MRN: [id number]?Date of Birth: 3Jan 31, 1982? ? ? ?

## 2021-12-04 NOTE — Telephone Encounter (Signed)
Left vm per DR. Caralee Ates pt does not qualify for personal care services ?

## 2021-12-07 ENCOUNTER — Ambulatory Visit: Payer: Medicaid Other | Admitting: Physical Therapy

## 2021-12-07 ENCOUNTER — Telehealth: Payer: Self-pay

## 2021-12-07 NOTE — Telephone Encounter (Signed)
Pt did not show for appointment this date, no message was received prior to scheduled time. Author reached out and left secure VM. Author explained that patient has had multiple no-shows to our office, per policy she is permitted 1 scheduled visit at a time now. Author made known that any additional no-shows would result in DC from our services. Pt encouraged to contact clinic at he rearliest convenience.  ? ?Other no-show appointments were as follows: ?11/23/21 & 11/20/21 (2 consecutive visits)  ?11/12/21 ?11/05/21, 11/03/21 & 11/02/21 (3 consecutive visits)  ? ? ?10:55 AM, 12/07/21 ?Etta Grandchild, PT, DPT ?Physical Therapist - [place] ?[phone] (Office) ? ?

## 2021-12-09 ENCOUNTER — Ambulatory Visit: Payer: Medicaid Other | Admitting: Physical Therapy

## 2021-12-14 ENCOUNTER — Ambulatory Visit: Payer: Medicaid Other | Attending: Physician Assistant

## 2021-12-14 DIAGNOSIS — G8929 Other chronic pain: Secondary | ICD-10-CM | POA: Diagnosis present

## 2021-12-14 DIAGNOSIS — M5441 Lumbago with sciatica, right side: Secondary | ICD-10-CM | POA: Diagnosis present

## 2021-12-14 NOTE — Addendum Note (Signed)
Addended by: Etta Grandchild on: 12/14/2021 09:14 AM ? ? Modules accepted: Orders ? ?

## 2021-12-14 NOTE — Therapy (Signed)
[facility] ?[address] ?[place], [place], [address] ?Phone: [phone]   Fax:  [phone] ? ?Physical Therapy Treatment ? ?Patient Details  ?Name: Yesenia Thomas ?MRN: [id number] ?Date of Birth: Jan 18, 1981 ?Referring Provider (PT): Erin Mecum ? ? ?Encounter Date: 12/14/2021 ? ? PT End of Session - 12/14/21 0853   ? ? Visit Number 8   ? Number of Visits 17   ? Date for PT Re-Evaluation 01/11/22   ? Authorization Type UHC  Medicaid Prepaid Auth; 27 annual visits PT/OT/ST   ? Authorization Time Period 12/14/21-01/11/22   ? Authorization - Visit Number 6   ? Authorization - Number of Visits 27   ? Progress Note Due on Visit 10   ? PT Start Time 0845   pt arrived late  ? PT Stop Time 0910   ? PT Time Calculation (min) 25 min   ? Activity Tolerance Patient tolerated treatment well;No increased pain   ? Behavior During Therapy [facility] for tasks assessed/performed   ? ?  ?  ? ?  ? ? ?Past Medical History:  ?Diagnosis Date  ? ADHD   ? Anxiety   ? Chlamydia   ? Depression   ? GERD (gastroesophageal reflux disease)   ? Iron deficiency anemia   ? Morbid obesity due to excess calories ([place])   ? PPD positive   ? Pregnancy induced hypertension   ? Trauma   ? Varicella   ? ? ?Past Surgical History:  ?Procedure Laterality Date  ? CESAREAN SECTION    ? ? ?There were no vitals filed for this visit. ? ? Subjective Assessment - 12/14/21 0851   ? ? Subjective Pt reports she is doing ok today, danced a little over the weekend. Her feet hurt. Her pain is 8/10 in bilat hips/thighs.   ? Pertinent History Yesenia Thomas is a 24yoF who is referred to OPPT for chronic ongoing low back pain with referral into legs. Pain has limited ability to stand and walk for >5 minutes, which has impacted ability to perfrom home care, childcare, and selfcare.   ? Currently in Pain? Yes   ? Pain Score 8    ? Pain Location --   bilat- lateral hips/thighs  ? ?  ?  ? ?  ? ? ? ?INTERVENTION this date: ?   ?[employer] Physio ball rollouts 15x3secH, 3x30secH  ? ?Seated marching 1x20 alternating  ?Seated trunk rotation with ball x20, alternating ?Seated overhead lift with blue ball 1x15 ?Magenta Ball Squeeze 15x3secH  ?Seated band reverse clam Yellow 1x15x3secH (magenta ball) ?STS elevated surface hands on thighs 1x10  ?Seated band clam blue TB 1x15 (magenta ball) ? ? ?INTERVENTION 12/04/21: ?  ?Big Blue Physio ball rollouts 15x3secH, 3x30secH  ?Seated marching 1x20 alternating  ?Seated trunk rotation with ball x20, alternating ?Ball squeeze with overhead lift with blue ball 1x10 ?Seated band clam blue TB 1x20 (magenta ball) ?Seated band reverse clam Yellow 1x20 (magenta ball) ?Seated marching 1x20 alternating  ?Ball squeeze with overhead lift with blue ball 1x10 ?Seated band clam blue TB 1x20 (magenta ball) ?Seated band reverse clam Yellow 1x20 (magenta ball) ? ? ? PT Education - 12/14/21 0858   ? ? Education Details improving core endurance as strength improves   ? Person(s) Educated Patient   ? Methods Explanation   ? Comprehension Verbalized understanding   ? ?  ?  ? ?  ? ? ? PT Short Term Goals - 11/30/21 0857   ? ?  ?  PT SHORT TERM GOAL #1  ? Title Pnt will decrease max NPS from 8/10 to 6/10 to improve ability to participate in community ADLs   ? Baseline 10/07/21 worst NPS 8/10; 11/30/21 8/10 today, on average pain still is higher than 6/10   ? Time 4   ? Period Weeks   ? Status Not Met   ? Target Date 11/04/21   ?  ? PT SHORT TERM GOAL #2  ? Title Pt will have 0/10 pain while sitting at desk for work for at least 30 min to complete occupational ADLs   ? Baseline 10/07/21 limited to 5 min; 11/30/21: Pt sits at in bed for work duties   ? Time 4   ? Period Weeks   ? Status Not Met   ? Target Date 11/04/21   ?  ? PT SHORT TERM GOAL #3  ? Title Pt will be able to perform bed mobilty with 6/10 pain on NPS to improve quality of sleep; 11/30/21 pain still getting as high as 8/10 on average   ? Baseline 10/07/21 NPS 8/10;   ?  Time 4   ? Period Weeks   ? Status Not Met   ? Target Date 11/04/21   ? ?  ?  ? ?  ? ? ? ? PT Long Term Goals - 12/14/21 0904   ? ?  ? PT LONG TERM GOAL #1  ? Title Pt will demonstrate 5xSTS of 10sec or less to demonstrate age matched BLE norms needed for funcitonal transfers   ? Baseline 10/07/21 unable to complete one STS without UE use;  11/30/21: 16.96sec hands on thighs   ? Time 8   ? Period Weeks   ? Status On-going   ? Target Date 01/11/22   ?  ? PT LONG TERM GOAL #2  ? Title Pt will be able to negotiate 4 steps with 0/10 pain on NPS to increase community navigation.   ? Baseline 10/07/21 limited to 1 step with subjective increased pain; 3/20: performs 4 stairs without exacerbaton of pain, but has some pain after performing 8 stairs.   ? Time 8   ? Period Weeks   ? Status Achieved   ? Target Date 12/06/21   ?  ? PT LONG TERM GOAL #3  ? Title Pt will be able to stand for 30 min with less than 4/10 pain on NPS in order to cook at home.   ? Baseline 10/07/21 cannot stand for more than 5 min without having to rest due to pain; 11/30/21: still uses a chair in kitchen when cooking.   ? Time 8   ? Period Weeks   ? Status On-going   ? Target Date 01/11/22   ?  ? PT LONG TERM GOAL #4  ? Title Patient will increase FOTO score to 60 to demonstrate predicted increase in functional mobility to complete ADLs   ? Baseline 10/07/21: 53 at evaluation; 11/30/21: 37   ? Time 8   ? Period Weeks   ? Status On-going   ? Target Date 01/11/22   ? ?  ?  ? ?  ? ? ? ? ? ? ? ? Plan - 12/14/21 0859   ? ? Clinical Impression Statement Continued with general core strenghtening. Pt is recerted today for 4 additional weeks as previously discussed as pt missed several appointment durign iniitital cert period and would like to make these visits up for optimal outcome. Pt tolerates entire session without exacerbation of  symptoms or pain. Pt would like to add some of these exercises to her HEP. Author will begin adding new items next session.   ?  Personal Factors and Comorbidities Fitness;Time since onset of injury/illness/exacerbation;Comorbidity 1   ? Comorbidities BMI   ? Examination-Activity Limitations Carry;Lift;Sit;Stand;Bed Mobility;Locomotion Level;Sleep;Bend;Squat;Transfers;Stairs   ? Examination-Participation Restrictions Cleaning;Yard Work;Community Activity;Laundry;Occupation   ? Stability/Clinical Decision Making Stable/Uncomplicated   ? Clinical Decision Making Moderate   ? Rehab Potential Fair   ? PT Frequency 2x / week   ? PT Duration 8 weeks   ? PT Treatment/Interventions ADLs/Self Care Home Management;Biofeedback;Cryotherapy;Electrical Stimulation;Iontophoresis 34m/ml Dexamethasone;Moist Heat;Traction;Ultrasound;Gait training;Stair training;Functional mobility training;Therapeutic activities;Therapeutic exercise;Balance training;Neuromuscular re-education;Patient/family education;Manual techniques;Manual lymph drainage;Compression bandaging;Passive range of motion;Dry needling;Energy conservation;Taping;Splinting;Vestibular;Spinal Manipulations;Joint Manipulations   ? PT Next Visit Plan continue with hip/core strengthening   ? PT Home Exercise Plan MedBridge Access Code: [email or web address]   ? Consulted and Agree with Plan of Care Patient   ? ?  ?  ? ?  ? ? ?Patient will benefit from skilled therapeutic intervention in order to improve the following deficits and impairments:  Abnormal gait, Decreased activity tolerance, Decreased endurance, Decreased strength, Dizziness, Hypomobility, Increased fascial restricitons, Impaired sensation, Improper body mechanics, Pain, Decreased mobility, Difficulty walking, Impaired tone, Postural dysfunction ? ?Visit Diagnosis: ?Chronic bilateral low back pain with right-sided sciatica ? ? ? ? ?Problem List ?Patient Active Problem List  ? Diagnosis Date Noted  ? Dizzy spells 08/26/2021  ? Lumbar back pain with radiculopathy affecting right lower extremity 08/26/2021  ? 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  ?  Dysthymia 08/26/2021  ? Severe obesity (BMI >= 40) ([place] 07/20/2017  ? Iron deficiency anemia 08/02/2013  ? 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  ? GERD (gastroesophageal reflux disease) 04/17/2013  ? ?9:10 AM, 12/14/21 ?All

## 2021-12-25 ENCOUNTER — Telehealth: Payer: Self-pay

## 2021-12-25 ENCOUNTER — Ambulatory Visit: Payer: Medicaid Other | Admitting: Physical Therapy

## 2021-12-25 NOTE — Telephone Encounter (Signed)
Pt did not arrive to scheduled appointment this morning. Author reached out to patient via cell number. Pt reports she was unaware of appointment. Author informed patient that pt would be discharged from PT services at this time due to persistent unannounced absences. This is decision is made in accordance with clinic policy. Pt acknowledges, denies any follow up questions.  ? ?Other no-show appointments were as follows: ?12/07/21 ?11/23/21 & 11/20/21 (2 consecutive visits)  ?11/12/21 ?11/05/21, 11/03/21 & 11/02/21 (3 consecutive visits)  ? ?12:46 PM, 12/25/21 ?Rosamaria Lints, PT, DPT ?Physical Therapist [facility] ?[phone] (Office) ? ?

## 2021-12-30 ENCOUNTER — Encounter: Payer: Self-pay | Admitting: Internal Medicine

## 2021-12-30 ENCOUNTER — Ambulatory Visit: Payer: Medicaid Other | Admitting: Internal Medicine

## 2021-12-30 ENCOUNTER — Ambulatory Visit: Payer: Medicaid Other | Admitting: Physical Therapy

## 2021-12-30 ENCOUNTER — Other Ambulatory Visit (HOSPITAL_COMMUNITY)
Admission: RE | Admit: 2021-12-30 | Discharge: 2021-12-30 | Disposition: A | Discharge status: Home / Self Care | Payer: Medicaid Other | Source: Ambulatory Visit | Attending: Internal Medicine | Admitting: Internal Medicine

## 2021-12-30 VITALS — BP 128/72 | HR 108 | Temp 98.4°F | Resp 18 | Ht 66.0 in | Wt 377.3 lb

## 2021-12-30 DIAGNOSIS — Z13220 Encounter for screening for lipoid disorders: Secondary | ICD-10-CM

## 2021-12-30 DIAGNOSIS — R197 Diarrhea, unspecified: Secondary | ICD-10-CM | POA: Diagnosis not present

## 2021-12-30 DIAGNOSIS — Z114 Encounter for screening for human immunodeficiency virus [HIV]: Secondary | ICD-10-CM | POA: Diagnosis not present

## 2021-12-30 DIAGNOSIS — Z124 Encounter for screening for malignant neoplasm of cervix: Secondary | ICD-10-CM

## 2021-12-30 DIAGNOSIS — Z1159 Encounter for screening for other viral diseases: Secondary | ICD-10-CM | POA: Diagnosis not present

## 2021-12-30 DIAGNOSIS — Z0001 Encounter for general adult medical examination with abnormal findings: Secondary | ICD-10-CM

## 2021-12-30 DIAGNOSIS — Z0000 Encounter for general adult medical examination without abnormal findings: Secondary | ICD-10-CM

## 2021-12-30 NOTE — Progress Notes (Signed)
Name: Yesenia Thomas   MRN: [id number]    DOB: 1980/09/28   Date:12/30/2021 ? ?     Progress Note ? ?Subjective ? ?Chief Complaint ? ?Chief Complaint  ?Patient presents with  ? Annual Exam  ?  W/ pap  ? ? ?HPI ? ?Patient presents for annual CPE. Is having another issues today, acute diarrhea for the last 5 days. Having episodes of diarrhea about 2-3 times a day. Worried it is infectious because she ate a bad salad from [place] prior to symptoms starting and a friend got sick with similar symptoms as well. Also endorses a subjective fever, chills, fatigue. Occasional cramping abdominal pain, no nausea or vomiting.  ? ?Diet: working on diet  ?Exercise:  no exercise routine in place ? ?[facility] Visit from [facility]/24/20[facility]  ?AUDIT-C Score 0  ? ?  ? ?Depression: Phq 9 is  negative ? ?  12/30/2021  ? 10:58 AM 12/01/2021  ?  3:17 PM 09/28/2021  ?  9:11 AM 09/16/2021  ? 11:20 AM 08/26/2021  ? 10:02 AM  ?Depression screen PHQ 2/9  ?Decreased Interest 1 0 _0 ?Down, Depressed, Hopeless 1 0 _1 ?PHQ - 2 Score 2 0 _2 ?Altered sleeping 0 0 0 0 3  ?Tired, decreased energy 1 0 0 0 3  ?Change in appetite 0 0 0 0 0  ?Feeling bad or failure about yourself  0 0 0 0 2  ?Trouble concentrating 0 0 0 0 3  ?Moving slowly or fidgety/restless 0 0 0 0 0  ?Suicidal thoughts 0 0 0 0 0  ?PHQ-9 Score 3 0 _3 ?Difficult doing work/chores Not difficult at all Not difficult at all Not difficult at all Not difficult at all Very difficult  ? ?Hypertension: ?BP Readings from Last 3 Encounters:  ?12/30/21 128/72  ?12/01/21 130/86  ?09/28/21 124/68  ? ?Obesity: ?Wt Readings from Last 3 Encounters:  ?12/30/21 (!) 377 lb 4.8 oz (171.1 kg)  ?12/01/21 (!) 380 lb 6.4 oz (172.5 kg)  ?09/28/21 (!) 370 lb 12.8 oz (168.2 kg)  ? ?BMI Readings from Last 3 Encounters:  ?12/30/21 60.90 kg/m?  ?12/01/21 61.40 kg/m?  ?09/28/21 59.85 kg/m?  ?  ? ?Vaccines:  ? ?HPV:  ?Tdap: 4/17 ?Shingrix:  ?Pneumonia:  ?Flu:   ?COVID-19: ? ? ?Hep C Screening: 10/2015 [facility] ?STD testing and prevention (HIV/chl/gon/syphilis): no concerns, not sexually active currently  ?Intimate partner violence: negative screen  ?Menstrual History/LMP/Abnormal Bleeding:  ?Discussed importance of follow up if any post-menopausal bleeding: not applicable  ?Incontinence Symptoms: negative for symptoms  ? ?Breast cancer:  ?- Last Mammogram: ordered, will call to schedule ? ?Osteoporosis Prevention : Discussed high calcium and vitamin D supplementation, weight bearing exercises ?Bone density :not applicable  ? ?Cervical cancer screening: due today  ? ?Skin cancer: Discussed monitoring for atypical lesions  ?Colorectal cancer: n/a   ?Lung cancer:  Low Dose CT Chest recommended if Age 88-80 years, 20 pack-year currently smoking OR have quit w/in 15years. Patient does not qualify for screen   ?ECG: 12/22 ? ?Advanced Care Planning: A voluntary discussion about advance care planning including the explanation and discussion of advance directives.  Discussed health care proxy and Living will, and the patient was able to identify a health care proxy as mother Claudina Lick and father Zariyah Stephens.  Patient does not have a living will and power of attorney of health care.  ? ?  Lipids: ?No results found for: CHOL ?No results found for: HDL ?No results found for: LDLCALC ?No results found for: TRIG ?No results found for: CHOLHDL ?No results found for: LDLDIRECT ? ?Glucose: ?Glucose, Bld  ?Date Value Ref Range Status  ?09/16/2021 99 65 - 99 mg/dL Final  ?  Comment:  ?  . ?           Fasting reference interval ?. ?  ?02/24/2021 83 65 - 99 mg/dL Final  ?  Comment:  ?  . ?           Fasting reference interval ?. ?  ?05/25/2019 90 65 - 99 mg/dL Final  ?  Comment:  ?  . ?           Fasting reference interval ?. ?  ? ? ?Patient Active Problem List  ? Diagnosis Date Noted  ? Dizzy spells 08/26/2021  ? Lumbar back pain with radiculopathy affecting right lower extremity 08/26/2021  ?  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  ? Dysthymia 08/26/2021  ? Severe obesity (BMI >= 40) ([facility]) 07/20/2017  ? Iron deficiency anemia 08/02/2013  ? 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  ? GERD (gastroesophageal reflux disease) 04/17/2013  ? ? ?Past Surgical History:  ?Procedure Laterality Date  ? CESAREAN SECTION    ? ? ?Family History  ?Problem Relation Age of Onset  ? Hypertension Mother   ? Thrombosis Maternal Grandmother   ? Clotting disorder Maternal Grandfather   ? Deep vein thrombosis Maternal Grandfather   ? Heart attack Paternal Grandmother   ? ? ?Social History  ? ?Socioeconomic History  ? Marital status: Unknown  ?  Spouse name: Not on file  ? Number of children: Not on file  ? Years of education: Not on file  ? Highest education level: Not on file  ?Occupational History  ? Not on file  ?Tobacco Use  ? Smoking status: Never  ? Smokeless tobacco: Never  ?Vaping Use  ? Vaping Use: Never used  ?Substance and Sexual Activity  ? Alcohol use: Not Currently  ? Drug use: Not Currently  ?  Types: Marijuana  ? Sexual activity: Yes  ?Other Topics Concern  ? Not on file  ?Social History Narrative  ? Not on file  ? ?Social Determinants of Health  ? ?Financial Resource Strain: Not on file  ?Food Insecurity: Not on file  ?Transportation Needs: Not on file  ?Physical Activity: Not on file  ?Stress: Not on file  ?Social Connections: Not on file  ?Intimate Partner Violence: Not on file  ? ? ? ?Current Outpatient Medications:  ?  Accu-Chek Softclix Lancets lancets, Use as instructed, Disp: 100 each, Rfl: 12 ?  Acetaminophen 500 MG coapsule, Take by mouth as needed., Disp: , Rfl:  ?  blood glucose meter kit and supplies KIT, Dispense based on patient and insurance preference. Use up to four times daily as directed., Disp: 1 each, Rfl: 0 ?  famotidine (PEPCID) 20 MG tablet, Take 20 mg by mouth 2 (two) times daily as needed for heartburn or indigestion., Disp: , Rfl:  ?  ferrous gluconate (FERGON) 324 MG tablet, Take 1 tablet (324 mg  total) by mouth daily., Disp: 90 tablet, Rfl: 3 ?  glucose blood test strip, Use as instructed, Disp: 100 each, Rfl: 12 ?  ibuprofen (ADVIL) 800 MG tablet, Take 1 tablet (800 mg total) by mouth every 8 (eight) hours as needed., Disp: 30 tablet, Rfl: 3 ? ?Allergies  ?Allergen Reactions  ? Lactose Intolerance (Gi)   ?   Percocet [Oxycodone-Acetaminophen]   ? ? ? ?Review of Systems  ?All other systems reviewed and are negative. ? ? ? ?Objective ? ?Vitals:  ? 12/30/21 1050  ?BP: 128/72  ?Pulse: (!) 108  ?Resp: 18  ?Temp: 98.4 ?F (36.9 ?C)  ?SpO2: 97%  ?Weight: (!) 377 lb 4.8 oz (171.1 kg)  ?Height: 5' 6" (1.676 m)  ? ? ?Body mass index is 60.9 kg/m?. ? ?Physical Exam ?Constitutional:   ?   Appearance: Normal appearance. She is obese.  ?HENT:  ?   Head: Normocephalic and atraumatic.  ?Eyes:  ?   Conjunctiva/sclera: Conjunctivae normal.  ?Cardiovascular:  ?   Rate and Rhythm: Normal rate and regular rhythm.  ?Pulmonary:  ?   Effort: Pulmonary effort is normal.  ?   Breath sounds: Normal breath sounds.  ?Genitourinary: ?   Comments: External genitalia within normal limits.  Vaginal mucosa pink, moist, normal rugae.  Nonfriable cervix without lesions, no discharge or bleeding noted on speculum exam.   ? ?Musculoskeletal:  ?   Right lower leg: No edema.  ?   Left lower leg: No edema.  ?Skin: ?   General: Skin is warm and dry.  ?Neurological:  ?   General: No focal deficit present.  ?   Mental Status: She is alert. Mental status is at baseline.  ?Psychiatric:     ?   Mood and Affect: Mood normal.     ?   Behavior: Behavior normal.  ? ? ? ?Recent Results (from the past 2160 hour(s))  ?ECHOCARDIOGRAM COMPLETE     Status: None  ? Collection Time: 10/12/21 11:54 AM  ?Result Value Ref Range  ? AR max vel 3.13 cm2  ? AV Peak grad 11.6 mmHg  ? Ao pk vel 1.70 m/s  ? S' Lateral 2.90 cm  ? Area-P 1/2 2.76 cm2  ? AV Area VTI 2.98 cm2  ? AV Mean grad 7.0 mmHg  ? Single Plane A4C EF 67.6 %  ? Single Plane A2C EF 70.1 %  ? Calc EF 69.0 %  ?  AV Area mean vel 3.33 cm2  ? ? ? ?Fall Risk: ? ?  12/30/2021  ? 10:57 AM 12/01/2021  ?  3:17 PM 09/28/2021  ?  9:11 AM 09/16/2021  ? 11:20 AM 08/26/2021  ? 10:01 AM  ?Fall Risk   ?Falls in the past year? 0 0 0 0 0  ?N

## 2021-12-30 NOTE — Patient Instructions (Signed)
Health Maintenance, Female ?Adopting a healthy lifestyle and getting preventive care are important in promoting health and wellness. Ask your health care provider about: ?The right schedule for you to have regular tests and exams. ?Things you can do on your own to prevent diseases and keep yourself healthy. ?What should I know about diet, weight, and exercise? ?Eat a healthy diet ? ?Eat a diet that includes plenty of vegetables, fruits, low-fat dairy products, and lean protein. ?Do not eat a lot of foods that are high in solid fats, added sugars, or sodium. ?Maintain a healthy weight ?Body mass index (BMI) is used to identify weight problems. It estimates body fat based on height and weight. Your health care provider can help determine your BMI and help you achieve or maintain a healthy weight. ?Get regular exercise ?Get regular exercise. This is one of the most important things you can do for your health. Most adults should: ?Exercise for at least 150 minutes each week. The exercise should increase your heart rate and make you sweat (moderate-intensity exercise). ?Do strengthening exercises at least twice a week. This is in addition to the moderate-intensity exercise. ?Spend less time sitting. Even light physical activity can be beneficial. ?Watch cholesterol and blood lipids ?Have your blood tested for lipids and cholesterol at 41 years of age, then have this test every 5 years. ?Have your cholesterol levels checked more often if: ?Your lipid or cholesterol levels are high. ?You are older than 41 years of age. ?You are at high risk for heart disease. ?What should I know about cancer screening? ?Depending on your health history and family history, you may need to have cancer screening at various ages. This may include screening for: ?Breast cancer. ?Cervical cancer. ?Colorectal cancer. ?Skin cancer. ?Lung cancer. ?What should I know about heart disease, diabetes, and high blood pressure? ?Blood pressure and heart  disease ?High blood pressure causes heart disease and increases the risk of stroke. This is more likely to develop in people who have high blood pressure readings or are overweight. ?Have your blood pressure checked: ?Every 3-5 years if you are 19-36 years of age. ?Every year if you are 48 years old or older. ?Diabetes ?Have regular diabetes screenings. This checks your fasting blood sugar level. Have the screening done: ?Once every three years after age 18 if you are at a normal weight and have a low risk for diabetes. ?More often and at a younger age if you are overweight or have a high risk for diabetes. ?What should I know about preventing infection? ?Hepatitis B ?If you have a higher risk for hepatitis B, you should be screened for this virus. Talk with your health care provider to find out if you are at risk for hepatitis B infection. ?Hepatitis C ?Testing is recommended for: ?Everyone born from [birth year] through 1965. ?Anyone with known risk factors for hepatitis C. ?Sexually transmitted infections (STIs) ?Get screened for STIs, including gonorrhea and chlamydia, if: ?You are sexually active and are younger than 41 years of age. ?You are older than 41 years of age and your health care provider tells you that you are at risk for this type of infection. ?Your sexual activity has changed since you were last screened, and you are at increased risk for chlamydia or gonorrhea. Ask your health care provider if you are at risk. ?Ask your health care provider about whether you are at high risk for HIV. Your health care provider may recommend a prescription medicine to help prevent HIV  infection. If you choose to take medicine to prevent HIV, you should first get tested for HIV. You should then be tested every 3 months for as long as you are taking the medicine. ?Pregnancy ?If you are about to stop having your period (premenopausal) and you may become pregnant, seek counseling before you get pregnant. ?Take 400 to 800  micrograms (mcg) of folic acid every day if you become pregnant. ?Ask for birth control (contraception) if you want to prevent pregnancy. ?Osteoporosis and menopause ?Osteoporosis is a disease in which the bones lose minerals and strength with aging. This can result in bone fractures. If you are 12 years old or older, or if you are at risk for osteoporosis and fractures, ask your health care provider if you should: ?Be screened for bone loss. ?Take a calcium or vitamin D supplement to lower your risk of fractures. ?Be given hormone replacement therapy (HRT) to treat symptoms of menopause. ?Follow these instructions at home: ?Alcohol use ?Do not drink alcohol if: ?Your health care provider tells you not to drink. ?You are pregnant, may be pregnant, or are planning to become pregnant. ?If you drink alcohol: ?Limit how much you have to: ?0-1 drink a day. ?Know how much alcohol is in your drink. In the [place] one drink equals one 12 oz bottle of beer (355 mL), one 5 oz glass of wine (148 mL), or one 1? oz glass of hard liquor (44 mL). ?Lifestyle ?Do not use any products that contain nicotine or tobacco. These products include cigarettes, chewing tobacco, and vaping devices, such as e-cigarettes. If you need help quitting, ask your health care provider. ?Do not use street drugs. ?Do not share needles. ?Ask your health care provider for help if you need support or information about quitting drugs. ?General instructions ?Schedule regular health, dental, and eye exams. ?Stay current with your vaccines. ?Tell your health care provider if: ?You often feel depressed. ?You have ever been abused or do not feel safe at home. ?Summary ?Adopting a healthy lifestyle and getting preventive care are important in promoting health and wellness. ?Follow your health care provider's instructions about healthy diet, exercising, and getting tested or screened for diseases. ?Follow your health care provider's instructions on monitoring your  cholesterol and blood pressure. ?This information is not intended to replace advice given to you by your health care provider. Make sure you discuss any questions you have with your health care provider. ?Document Revised: 01/19/2021 Document Reviewed: 01/19/2021 ?Elsevier Patient Education ? 2023 [employer] ? ?   Why follow it? Research shows? ?Those who follow the Mediterranean diet have a reduced risk of heart disease  ?The diet is associated with a reduced incidence of Parkinson's and Alzheimer's diseases ?People following the diet may have longer life expectancies and lower rates of chronic diseases  ?The Dietary Guidelines for Americans recommends the Mediterranean diet as an eating plan to promote health and prevent disease ? ?What Is the Mediterranean Diet?  ?Healthy eating plan based on typical foods and recipes of Mediterranean-style cooking ?The diet is primarily a plant based diet; these foods should make up a majority of meals  ? ?Starches - Plant based foods should make up a majority of meals ?- They are an important sources of vitamins, minerals, energy, antioxidants, and fiber ?- Choose whole grains, foods high in fiber and minimally processed items  ?- Typical grain sources include wheat, oats, barley, corn, brown rice, bulgar, farro, millet, polenta, couscous  ?- Various types of beans  include chickpeas, lentils, fava beans, black beans, white beans   ?Fruits  Veggies - Large quantities of antioxidant rich fruits & veggies; 6 or more servings  ?- Vegetables can be eaten raw or lightly drizzled with oil and cooked  ?- Vegetables common to the traditional Mediterranean Diet include: artichokes, arugula, beets, broccoli, brussel sprouts, cabbage, carrots, celery, collard greens, cucumbers, eggplant, kale, leeks, lemons, lettuce, mushrooms, okra, onions, peas, peppers, potatoes, pumpkin, radishes, rutabaga, shallots, spinach, sweet potatoes, turnips, zucchini ?- Fruits common to the Mediterranean  Diet include: apples, apricots, avocados, cherries, clementines, dates, figs, grapefruits, grapes, melons, nectarines, oranges, peaches, pears, pomegranates, strawberries, tangerines  ?Fats - Replace butter and

## 2021-12-31 LAB — HIV ANTIBODY (ROUTINE TESTING W REFLEX): HIV 1&2 Ab, 4th Generation: NONREACTIVE

## 2021-12-31 LAB — LIPID PANEL
Cholesterol: 168 mg/dL (ref <200)
HDL: 39 mg/dL — ABNORMAL LOW (ref >=50)
LDL Cholesterol (Calc): 108 mg/dL (calc) — ABNORMAL HIGH
Non-HDL Cholesterol (Calc): 129 mg/dL (calc) (ref <130)
Total CHOL/HDL Ratio: 4.3 (calc) (ref <5.0)
Triglycerides: 114 mg/dL (ref <150)

## 2021-12-31 LAB — CYTOLOGY - PAP
Adequacy: ABSENT
Comment: NEGATIVE
Diagnosis: NEGATIVE
High risk HPV: NEGATIVE

## 2022-01-01 ENCOUNTER — Ambulatory Visit: Payer: Medicaid Other | Admitting: Physical Therapy

## 2022-01-04 ENCOUNTER — Encounter: Payer: Medicaid Other | Admitting: Physical Therapy

## 2022-01-07 ENCOUNTER — Encounter: Payer: Medicaid Other | Admitting: Physical Therapy

## 2022-02-03 ENCOUNTER — Ambulatory Visit (INDEPENDENT_AMBULATORY_CARE_PROVIDER_SITE_OTHER): Payer: Medicaid Other | Admitting: Podiatry

## 2022-02-03 DIAGNOSIS — Z91199 Patient's noncompliance with other medical treatment and regimen due to unspecified reason: Secondary | ICD-10-CM

## 2022-02-03 NOTE — Progress Notes (Signed)
Patient was no-show for appointment today 

## 2022-02-22 ENCOUNTER — Ambulatory Visit: Payer: Self-pay | Admitting: *Deleted

## 2022-02-22 ENCOUNTER — Ambulatory Visit: Payer: Medicaid Other | Admitting: Internal Medicine

## 2022-02-22 ENCOUNTER — Ambulatory Visit
Admission: RE | Admit: 2022-02-22 | Discharge: 2022-02-22 | Disposition: A | Discharge status: Home / Self Care | Payer: Medicaid Other | Source: Ambulatory Visit | Attending: Internal Medicine | Admitting: Internal Medicine

## 2022-02-22 ENCOUNTER — Ambulatory Visit
Admission: RE | Admit: 2022-02-22 | Discharge: 2022-02-22 | Disposition: A | Discharge status: Home / Self Care | Payer: Medicaid Other | Attending: Internal Medicine | Admitting: Internal Medicine

## 2022-02-22 ENCOUNTER — Encounter: Payer: Self-pay | Admitting: Internal Medicine

## 2022-02-22 VITALS — BP 126/78 | HR 96 | Temp 98.1°F | Resp 16 | Ht 66.0 in | Wt 366.1 lb

## 2022-02-22 DIAGNOSIS — R0602 Shortness of breath: Secondary | ICD-10-CM

## 2022-02-22 DIAGNOSIS — J189 Pneumonia, unspecified organism: Secondary | ICD-10-CM | POA: Diagnosis not present

## 2022-02-22 DIAGNOSIS — R051 Acute cough: Secondary | ICD-10-CM

## 2022-02-22 LAB — POCT INFLUENZA A/B
Influenza A, POC: NEGATIVE
Influenza B, POC: NEGATIVE

## 2022-02-22 MED ORDER — METHYLPREDNISOLONE 4 MG PO TBPK: ORAL_TABLET | ORAL | 0 refills | Status: DC

## 2022-02-22 MED ORDER — ALBUTEROL SULFATE HFA 108 (90 BASE) MCG/ACT IN AERS: 2.0000 | INHALATION_SPRAY | Freq: Four times a day (QID) | RESPIRATORY_TRACT | 0 refills | Status: DC | PRN

## 2022-02-22 MED ORDER — BENZONATATE 100 MG PO CAPS: 100.0000 mg | ORAL_CAPSULE | Freq: Two times a day (BID) | ORAL | 0 refills | Status: DC | PRN

## 2022-02-22 MED ORDER — DEXTROMETHORPHAN POLISTIREX ER 30 MG/5ML PO SUER: 15.0000 mg | ORAL | 0 refills | Status: DC | PRN

## 2022-02-22 NOTE — Progress Notes (Signed)
Acute Office Visit  Subjective:     Patient ID: Yesenia Thomas, female    DOB: August 24, 1981, 41 y.o.   MRN: [id number]  Chief Complaint  Patient presents with   Shortness of Breath    Cough, sweating    HPI Patient is in today for shortness of breath.   URI Compliant:  -Symptoms started about 3 days ago and is getting worse.  Started with shortness of breath, nonproductive cough. -Worst symptom: cough  -Fever: no, but sweating extensviely  -Cough: yes, productive yellow/brown  -Shortness of breath: yes -Wheezing: yes -Chest pain: yes, with cough -Chest tightness: no -Chest congestion: no -Nasal congestion: yes -Runny nose: yes, yellow/brown -Post nasal drip: yes -Sore throat: yes -Sinus pressure: yes -Headache: yes -Face pain: no -Ear pain: no  -Ear pressure: no -Vomiting: no -Fatigue: yes -Sick contacts: no -Context: worse -Relief with OTC cold/cough medications: no  -Treatments attempted: cold/sinus, use son's nebulizer machine, which did help symptoms.   Review of Systems  Constitutional:  Positive for chills and malaise/fatigue. Negative for fever.  HENT:  Positive for congestion and sore throat. Negative for ear discharge, ear pain and sinus pain.   Respiratory:  Positive for cough, sputum production, shortness of breath and wheezing.   Cardiovascular:  Negative for chest pain.  Gastrointestinal:  Positive for diarrhea and nausea. Negative for abdominal pain and vomiting.  Genitourinary:  Negative for dysuria and hematuria.  Neurological:  Positive for dizziness and headaches.        Objective:    BP 126/78   Pulse 96   Temp 98.1 F (36.7 C)   Resp 16   Ht 5\' 6"  (1.676 m)   Wt (!) 366 lb 1.6 oz (166.1 kg)   SpO2 97%   BMI 59.09 kg/m  BP Readings from Last 3 Encounters:  02/22/22 126/78  12/30/21 128/72  12/01/21 130/86   Wt Readings from Last 3 Encounters:  02/22/22 (!) 366 lb 1.6 oz (166.1 kg)  12/30/21 (!) 377 lb 4.8 oz (171.1 kg)   12/01/21 (!) 380 lb 6.4 oz (172.5 kg)      Physical Exam Constitutional:      Appearance: She is well-developed. She is obese.     Comments: Ill-appearing, sweating noticeably  HENT:     Head: Normocephalic and atraumatic.     Right Ear: Tympanic membrane, ear canal and external ear normal.     Left Ear: Tympanic membrane, ear canal and external ear normal.     Nose: Nose normal.     Mouth/Throat:     Mouth: Mucous membranes are moist.     Pharynx: Oropharynx is clear.  Eyes:     Pupils: Pupils are equal, round, and reactive to light.  Cardiovascular:     Rate and Rhythm: Normal rate and regular rhythm.  Pulmonary:     Effort: Pulmonary effort is normal.     Breath sounds: Normal breath sounds. No wheezing or rhonchi.  Musculoskeletal:     Right lower leg: No edema.     Left lower leg: No edema.  Skin:    General: Skin is warm and dry.  Neurological:     General: No focal deficit present.     Mental Status: She is alert. Mental status is at baseline.  Psychiatric:        Mood and Affect: Mood normal.        Behavior: Behavior normal.     No results found for any visits on 02/22/22.  Assessment & Plan:   1. Acute cough/Shortness of breath: Symptoms going on about 3 days, patient is visually sweating and ill-appearing.  Heart rate borderline tachycardic, oxygen saturation good at 97%.  Blood pressure good.  Afebrile.  Will obtain CBC and CMP.  We will also obtain chest x-ray today.  Test for COVID and flu today as well.  Will treat with steroid pack, albuterol as needed for shortness of breath/wheezing and cough suppressant.  We will treat with antibiotics pending chest x-ray results.  If her symptoms get worse recommend she present to the ER.  Otherwise follow-up here if symptoms fail to improve or worsen.  - Novel Coronavirus, NAA ([facility]) - CBC w/Diff/Platelet - COMPLETE METABOLIC PANEL WITH GFR - methylPREDNISolone (MEDROL DOSEPAK) 4 MG TBPK tablet; Day 1:  Take 8 mg (2 tablets) before breakfast, 4 mg (1 tablet) after lunch, 4 mg (1 tablet) after supper, and 8 mg (2 tablets) at bedtime. Day 2:Take 4 mg (1 tablet) before breakfast, 4 mg (1 tablet) after lunch, 4 mg (1 tablet) after supper, and 8 mg (2 tablets) at bedtime. Day 3: Take 4 mg (1 tablet) before breakfast, 4 mg (1 tablet) after lunch, 4 mg (1 tablet) after supper, and 4 mg (1 tablet) at bedtime. Day 4: Take 4 mg (1 tablet) before breakfast, 4 mg (1 tablet) after lunch, and 4 mg (1 tablet) at bedtime. Day 5: Take 4 mg (1 tablet) before breakfast and 4 mg (1 tablet) at bedtime. Day 6: Take 4 mg (1 tablet) before breakfast.  Dispense: 1 each; Refill: 0 - benzonatate (TESSALON) 100 MG capsule; Take 1 capsule (100 mg total) by mouth 2 (two) times daily as needed for cough.  Dispense: 20 capsule; Refill: 0 - albuterol (VENTOLIN HFA) 108 (90 Base) MCG/ACT inhaler; Inhale 2 puffs into the lungs every 6 (six) hours as needed for wheezing or shortness of breath.  Dispense: 8 g; Refill: 0 - DG Chest 2 View; Future - POCT Influenza A/B   Return if symptoms worsen or fail to improve.  Margarita Mail, DO

## 2022-02-22 NOTE — Patient Instructions (Signed)
It was great seeing you today!  Plan discussed at today's visit: -Blood work ordered today, results will be uploaded to [facility].  -COVID and flu tested today -Please obtain chest x-ray from across the street today -Sent cough suppressant, steroid pack and inhaler to use as needed for shortness of breath to pharmacy -Also recommend Allegra D for allergies and decongestant, can get this over the counter   Follow up in: as needed  Take care and let us know if you have any questions or concerns prior to your next visit.  Dr. Rosana Berger

## 2022-02-22 NOTE — Telephone Encounter (Signed)
  Chief Complaint: cold symptoms/sinus congestion Symptoms: cold symptoms Frequency: constant Pertinent Negatives: Patient denies knowing if has fever Disposition: [] ED /[] Urgent Care (no appt availability in office) / [] Appointment(In office/virtual)/ []  [place] [facility]/ [] Home Care/ [] Refused Recommended Disposition /[] [facility]/ []  Follow-up with PCP Additional Notes: pt already has appt in 45 minutes. Put through to triage due to SOB. She states she took her son's inhaler and it helped.  Reason for Disposition  [1] Sinus pain (not just congestion) AND [2] fever  Answer Assessment - Initial Assessment Questions 1. ONSET: "When did the nasal discharge start?"      Not running but stopped up 2. AMOUNT: "How much discharge is there?"      moderate 3. COUGH: "Do you have a cough?" If yes, ask: "Describe the color of your sputum" (clear, white, yellow, green)     Yes, yellow 4. RESPIRATORY DISTRESS: "Describe your breathing."      mod 5. FEVER: "Do you have a fever?" If Yes, ask: "What is your temperature, how was it measured, and when did it start?"     sweating 6. SEVERITY: "Overall, how bad are you feeling right now?" (e.g., doesn't interfere with normal activities, staying home from school/work, staying in bed)      bad 7. OTHER SYMPTOMS: "Do you have any other symptoms?" (e.g., sore throat, earache, wheezing, vomiting)     no 8. PREGNANCY: "Is there any chance you are pregnant?" "When was your last menstrual period?"     na  Protocols used: Common Cold-A-AH

## 2022-02-23 ENCOUNTER — Other Ambulatory Visit: Payer: Self-pay | Admitting: Internal Medicine

## 2022-02-23 LAB — COMPLETE METABOLIC PANEL WITH GFR
AG Ratio: 1.2 (calc) (ref 1.0–2.5)
ALT: 16 U/L (ref 6–29)
AST: 21 U/L (ref 10–30)
Albumin: 3.9 g/dL (ref 3.6–5.1)
Alkaline phosphatase (APISO): 55 U/L (ref 31–125)
BUN: 7 mg/dL (ref 7–25)
CO2: 28 mmol/L (ref 20–32)
Calcium: 8.8 mg/dL (ref 8.6–10.2)
Chloride: 102 mmol/L (ref 98–110)
Creat: 0.62 mg/dL (ref 0.50–0.99)
Globulin: 3.2 g/dL (calc) (ref 1.9–3.7)
Glucose, Bld: 97 mg/dL (ref 65–99)
Potassium: 3.3 mmol/L — ABNORMAL LOW (ref 3.5–5.3)
Sodium: 140 mmol/L (ref 135–146)
Total Bilirubin: 0.3 mg/dL (ref 0.2–1.2)
Total Protein: 7.1 g/dL (ref 6.1–8.1)
eGFR: 115 mL/min/{1.73_m2} (ref >=60)

## 2022-02-23 LAB — CBC WITH DIFFERENTIAL/PLATELET
Absolute Monocytes: 1228 cells/uL — ABNORMAL HIGH (ref 200–950)
Basophils Absolute: 30 cells/uL (ref 0–200)
Basophils Relative: 0.4 %
Eosinophils Absolute: 311 cells/uL (ref 15–500)
Eosinophils Relative: 4.2 %
HCT: 38.4 % (ref 35.0–45.0)
Hemoglobin: 12.1 g/dL (ref 11.7–15.5)
Lymphs Abs: 2205 cells/uL (ref 850–3900)
MCH: 23.8 pg — ABNORMAL LOW (ref 27.0–33.0)
MCHC: 31.5 g/dL — ABNORMAL LOW (ref 32.0–36.0)
MCV: 75.4 fL — ABNORMAL LOW (ref 80.0–100.0)
MPV: 9.1 fL (ref 7.5–12.5)
Monocytes Relative: 16.6 %
Neutro Abs: 3626 cells/uL (ref 1500–7800)
Neutrophils Relative %: 49 %
Platelets: 315 10*3/uL (ref 140–400)
RBC: 5.09 10*6/uL (ref 3.80–5.10)
RDW: 16.8 % — ABNORMAL HIGH (ref 11.0–15.0)
Total Lymphocyte: 29.8 %
WBC: 7.4 10*3/uL (ref 3.8–10.8)

## 2022-02-23 MED ORDER — AZITHROMYCIN 250 MG PO TABS: ORAL_TABLET | ORAL | 0 refills | Status: AC

## 2022-02-23 MED ORDER — AMOXICILLIN 500 MG PO CAPS: 500.0000 mg | ORAL_CAPSULE | Freq: Two times a day (BID) | ORAL | 0 refills | Status: AC

## 2022-02-23 NOTE — Addendum Note (Signed)
Addended by: Margarita Mail on: 02/23/2022 03:38 PM   Modules accepted: Orders

## 2022-02-24 LAB — NOVEL CORONAVIRUS, NAA: SARS-CoV-2, NAA: NOT DETECTED

## 2022-04-07 ENCOUNTER — Emergency Department: Payer: Medicaid Other

## 2022-04-07 ENCOUNTER — Emergency Department
Admission: EM | Admit: 2022-04-07 | Discharge: 2022-04-07 | Disposition: A | Discharge status: Home / Self Care | Payer: Medicaid Other | Attending: Emergency Medicine | Admitting: Emergency Medicine

## 2022-04-07 ENCOUNTER — Encounter: Payer: Self-pay | Admitting: Emergency Medicine

## 2022-04-07 DIAGNOSIS — L03311 Cellulitis of abdominal wall: Secondary | ICD-10-CM | POA: Insufficient documentation

## 2022-04-07 DIAGNOSIS — R1031 Right lower quadrant pain: Secondary | ICD-10-CM | POA: Diagnosis present

## 2022-04-07 LAB — HEPATIC FUNCTION PANEL
ALT: 14 U/L (ref 0–44)
AST: 17 U/L (ref 15–41)
Albumin: 3.7 g/dL (ref 3.5–5.0)
Alkaline Phosphatase: 47 U/L (ref 38–126)
Bilirubin, Direct: <0.1 mg/dL (ref 0.0–0.2)
Total Bilirubin: 0.3 mg/dL (ref 0.3–1.2)
Total Protein: 7.2 g/dL (ref 6.5–8.1)

## 2022-04-07 LAB — TROPONIN I (HIGH SENSITIVITY): Troponin I (High Sensitivity): 4 ng/L (ref <18)

## 2022-04-07 LAB — BASIC METABOLIC PANEL
Anion gap: 6 (ref 5–15)
BUN: 10 mg/dL (ref 6–20)
CO2: 25 mmol/L (ref 22–32)
Calcium: 8.9 mg/dL (ref 8.9–10.3)
Chloride: 108 mmol/L (ref 98–111)
Creatinine, Ser: 0.86 mg/dL (ref 0.44–1.00)
GFR, Estimated: >60 mL/min (ref >60)
Glucose, Bld: 90 mg/dL (ref 70–99)
Potassium: 3.9 mmol/L (ref 3.5–5.1)
Sodium: 139 mmol/L (ref 135–145)

## 2022-04-07 LAB — CBC
HCT: 37.6 % (ref 36.0–46.0)
Hemoglobin: 11.2 g/dL — ABNORMAL LOW (ref 12.0–15.0)
MCH: 23 pg — ABNORMAL LOW (ref 26.0–34.0)
MCHC: 29.8 g/dL — ABNORMAL LOW (ref 30.0–36.0)
MCV: 77.2 fL — ABNORMAL LOW (ref 80.0–100.0)
Platelets: 326 10*3/uL (ref 150–400)
RBC: 4.87 MIL/uL (ref 3.87–5.11)
RDW: 19.2 % — ABNORMAL HIGH (ref 11.5–15.5)
WBC: 8.2 10*3/uL (ref 4.0–10.5)
nRBC: 0 % (ref 0.0–0.2)

## 2022-04-07 LAB — URINALYSIS, ROUTINE W REFLEX MICROSCOPIC
Bilirubin Urine: NEGATIVE
Glucose, UA: NEGATIVE mg/dL
Hgb urine dipstick: NEGATIVE
Ketones, ur: NEGATIVE mg/dL
Leukocytes,Ua: NEGATIVE
Nitrite: NEGATIVE
Protein, ur: NEGATIVE mg/dL
Specific Gravity, Urine: 1.013 (ref 1.005–1.030)
pH: 5 (ref 5.0–8.0)

## 2022-04-07 LAB — POC URINE PREG, ED: Preg Test, Ur: NEGATIVE

## 2022-04-07 LAB — CK: Total CK: 170 U/L (ref 38–234)

## 2022-04-07 LAB — LIPASE, BLOOD: Lipase: 23 U/L (ref 11–51)

## 2022-04-07 MED ORDER — LIDOCAINE 5 % EX PTCH
1.0000 | MEDICATED_PATCH | CUTANEOUS | Status: DC
Start: 2022-04-07 — End: 2022-04-07
  Administered 2022-04-07: 1 via TRANSDERMAL
  Filled 2022-04-07: qty 1

## 2022-04-07 MED ORDER — CEPHALEXIN 500 MG PO CAPS: 500.0000 mg | ORAL_CAPSULE | Freq: Two times a day (BID) | ORAL | 0 refills | Status: DC

## 2022-04-07 MED ORDER — IBUPROFEN 600 MG PO TABS: 600.0000 mg | ORAL_TABLET | Freq: Four times a day (QID) | ORAL | 0 refills | Status: DC | PRN

## 2022-04-07 MED ORDER — CEPHALEXIN 500 MG PO CAPS: 500.0000 mg | ORAL_CAPSULE | Freq: Two times a day (BID) | ORAL | 0 refills | Status: AC

## 2022-04-07 MED ORDER — IBUPROFEN 600 MG PO TABS: 600.0000 mg | ORAL_TABLET | Freq: Four times a day (QID) | ORAL | 0 refills | Status: AC | PRN

## 2022-04-07 MED ORDER — KETOROLAC TROMETHAMINE 30 MG/ML IJ SOLN
30.0000 mg | Freq: Once | INTRAMUSCULAR | Status: AC
  Administered 2022-04-07: 30 mg via INTRAMUSCULAR
  Filled 2022-04-07: qty 1

## 2022-04-07 MED ORDER — LIDOCAINE 5 % EX PTCH: 1.0000 | MEDICATED_PATCH | Freq: Two times a day (BID) | CUTANEOUS | 0 refills | Status: DC

## 2022-04-07 MED ORDER — LIDOCAINE 5 % EX PTCH: 1.0000 | MEDICATED_PATCH | Freq: Two times a day (BID) | CUTANEOUS | 0 refills | Status: AC

## 2022-04-07 MED ORDER — PANTOPRAZOLE SODIUM 20 MG PO TBEC: 20.0000 mg | DELAYED_RELEASE_TABLET | Freq: Every day | ORAL | 0 refills | Status: DC

## 2022-04-07 NOTE — Discharge Instructions (Addendum)
We are starting you on some antibiotics in case this could be an infection of the skin called cellulitis however if you develop vesicle lesions and return to the ER as this could be shingles.  You should take the ibuprofen with food I have started you on an acid reducer Protonix given your CT scan did show maybe a little bit of inflammation of your esophagus and this can cause some acid reflux-like symptoms.  You can use the lidocaine patches to help with any pain.  If this is not improving follow-up with your primary care doctor and return for fevers, worsening pain or any other concerns  IMPRESSION: 1. Anterior abdominal wall skin thickening with subcutaneous stranding may reflect cellulitis recommend correlation with direct visualization. 2. No renal, ureteral or bladder calculi identified. 3. Small hiatal hernia with mild nonspecific distal esophageal wall thickening possibly reflecting esophagitis.

## 2022-04-07 NOTE — ED Provider Notes (Addendum)
[facility] Provider Note    Event Date/Time   First MD Initiated Contact with Patient 04/07/22 [id number]     (approximate)   History   Groin Pain and Chest Pain   HPI  Yesenia Thomas is a 41 y.o. female  with obesity, who comes in with back pain.  Patient reports having 2 to 3 days of right-sided buttock back pain that radiates down into her groin.  She reports that it feels more like a burning sensation.  She denies ever having this previously.  She still able to ambulate and denies any falls.  She reports that it feels like she is got some swelling on that side.  She denies any sexual activity in the past 5 years but does have a 28-1/2-year-old.  She reports that at that time she did get STD testing and it was all negative and she denies any new partner since then.  She denies any vaginal discharge, vaginal bleeding.  She reports that yesterday she was walking and started to feel a little bit of chest discomfort and shortness of breath.  She was worried that she might have a blood clot given her family member had had one at 1 point.  She denies any risk factors for blood clots including coughing up blood, long travel, recent surgery, estrogen use.  Reviewed cardiology note were she had dizziness and presyncope on 09/11/21 sounded like vaso vagal       Physical Exam   Triage Vital Signs: ED Triage Vitals  Enc Vitals Group     BP 04/07/22 0405 (!) 148/97     Pulse Rate 04/07/22 0405 77     Resp 04/07/22 0405 16     Temp 04/07/22 0405 98.4 F (36.9 C)     Temp Source 04/07/22 0405 Oral     SpO2 04/07/22 0405 99 %     Weight 04/07/22 0416 (!) 367 lb (166.5 kg)     Height 04/07/22 0416 5\' 6"  (1.676 m)     Head Circumference --      Peak Flow --      Pain Score --      Pain Loc --      Pain Edu? --      Excl. in GC? --     Most recent vital signs: Vitals:   04/07/22 0405  BP: (!) 148/97  Pulse: 77  Resp: 16  Temp: 98.4 F (36.9 C)  SpO2: 99%      General: Awake, no distress.  CV:  Good peripheral perfusion.  Resp:  Normal effort.  Abd:  No distention.  Other:  Patient stood up and I do not see any obvious swelling noted to her buttock area.  No redness, no warmth.  I examined her groin area and again there was no redness, warmth, abscess, lymphadenopathy.  She is got good distal pulse able to lift the leg up off the bed bilaterally.  Her sensations intact bilaterally.  There is no rash noted.   ED Results / Procedures / Treatments   Labs (all labs ordered are listed, but only abnormal results are displayed) Labs Reviewed  CBC - Abnormal; Notable for the following components:      Result Value   Hemoglobin 11.2 (*)    MCV 77.2 (*)    MCH 23.0 (*)    MCHC 29.8 (*)    RDW 19.2 (*)    All other components within normal limits  BASIC METABOLIC PANEL  POC URINE PREG, ED  TROPONIN I (HIGH SENSITIVITY)  TROPONIN I (HIGH SENSITIVITY)     EKG  My interpretation of EKG:  Normal sinus rate of 70 without any ST elevation or T wave inversions, normal intervals  RADIOLOGY I have reviewed the xray personally and interpreted no evidence of any pneumonia  IMPRESSION: Negative.  No cardiopulmonary abnormality.  IMPRESSION: 1. Anterior abdominal wall skin thickening with subcutaneous stranding may reflect cellulitis recommend correlation with direct visualization. 2. No renal, ureteral or bladder calculi identified. 3. Small hiatal hernia with mild nonspecific distal esophageal wall thickening possibly reflecting esophagitis.    IMPRESSION: No evidence of DVT in the right lower extremity.        PROCEDURES:  Critical Care performed: No  Procedures   MEDICATIONS ORDERED IN ED: Medications  lidocaine (LIDODERM) 5 % 1 patch (1 patch Transdermal Patch Applied 04/07/22 0828)  ketorolac (TORADOL) 30 MG/ML injection 30 mg (30 mg Intramuscular Given 04/07/22 0828)     IMPRESSION / MDM / ASSESSMENT AND PLAN / ED  COURSE  I reviewed the triage vital signs and the nursing notes.   Patient's presentation is most consistent with acute presentation with potential threat to life or bodily function.   Patient comes in with right-sided buttock pain going down into her right leg and into the right groin area.  I visually inspected the area do not see any evidence of abscess or obvious cellulitis.  We offered to do a pelvic exam but she denies any recent sexual activity or symptoms inside her vagina.  We discussed the possibility of this being an early shingles and to return if she develops any pustules which she is aware of.  We did an ultrasound to make sure there is no evidence of DVT which was negative.  She got good distal pulse unlikely arterial issue.  She has had prior x-rays of the hip that have been reassuring.  We did do a CT scan just to make sure that there is no evidence of kidney stone, pyelonephritis and the cyst was all reassuring although there was some concern for possible cellulitis.  I do not see any obvious cellulitis but given this is where she has developed her symptoms we will start a course of Keflex.  Think that this could be related to a sciatic nerve issue as well we will give some ibuprofen to help with inflammation.  Patient has been ambulatory without any neurodeficits, new urinary incontinence or saddle anesthesia so no evidence of cord compression.  She had no midline back tenderness.  We will treat with ibuprofen, cellulitis.  Patient has some incidentally noted hiatal hernia and have given a copy of the report for her.  Her pregnancy test was negative and we recommended a new urine sample since triage has thrown out just to make sure there is no UTI but she denies any urinary symptoms and declines  For the chest pain and shortness of breath patient is  PERC negative with reassuring EKG and troponin.  Her heart score is low and given symptoms have been going on since yesterday and have since  resolved do not feel like repeat troponin is necessary.  Pregnancy test negative.  BMP reassuring.  CBC shows slightly low hemoglobin 11.2 but similar to priors.  Troponin is negative and symptoms of been going on for greater than 3 hours.  LFTs are normal.  Lipase is normal CK is negative     FINAL CLINICAL IMPRESSION(S) / ED DIAGNOSES  Final diagnoses:  Cellulitis of abdominal wall     Rx / DC Orders   ED Discharge Orders          Ordered    cephALEXin (KEFLEX) 500 MG capsule  2 times daily        04/07/22 0926    ibuprofen (ADVIL) 600 MG tablet  Every 6 hours PRN        04/07/22 0926    lidocaine (LIDODERM) 5 %  Every 12 hours        04/07/22 0926    pantoprazole (PROTONIX) 20 MG tablet  Daily        04/07/22 0926             Note:  This document was prepared using Dragon voice recognition software and may include unintentional dictation errors.   Concha Se, MD 04/07/22 [id number]    Concha Se, MD 04/07/22 [phone]

## 2022-04-07 NOTE — ED Triage Notes (Signed)
Pt c/o right sided groin pain that radiates into right hip x2 days. Pt describes pain as burning sensation under skin and pain is intermittent. Pt also c/o SOB and chest pain while walking to dumpster this morning. Pt sts pain feels like a "bubble" in chest.

## 2022-05-03 ENCOUNTER — Ambulatory Visit (INDEPENDENT_AMBULATORY_CARE_PROVIDER_SITE_OTHER): Payer: Medicaid Other | Admitting: Internal Medicine

## 2022-05-03 ENCOUNTER — Encounter: Payer: Self-pay | Admitting: Podiatry

## 2022-05-03 ENCOUNTER — Ambulatory Visit: Payer: Medicaid Other | Admitting: Podiatry

## 2022-05-03 ENCOUNTER — Encounter: Payer: Self-pay | Admitting: Internal Medicine

## 2022-05-03 VITALS — BP 126/82 | HR 108 | Temp 98.2°F | Resp 20 | Ht 66.0 in | Wt 374.2 lb

## 2022-05-03 DIAGNOSIS — B351 Tinea unguium: Secondary | ICD-10-CM | POA: Diagnosis not present

## 2022-05-03 DIAGNOSIS — N926 Irregular menstruation, unspecified: Secondary | ICD-10-CM

## 2022-05-03 DIAGNOSIS — M79675 Pain in left toe(s): Secondary | ICD-10-CM

## 2022-05-03 DIAGNOSIS — K219 Gastro-esophageal reflux disease without esophagitis: Secondary | ICD-10-CM

## 2022-05-03 DIAGNOSIS — N912 Amenorrhea, unspecified: Secondary | ICD-10-CM

## 2022-05-03 DIAGNOSIS — Z6841 Body Mass Index (BMI) 40.0 and over, adult: Secondary | ICD-10-CM

## 2022-05-03 DIAGNOSIS — M79674 Pain in right toe(s): Secondary | ICD-10-CM

## 2022-05-03 DIAGNOSIS — D509 Iron deficiency anemia, unspecified: Secondary | ICD-10-CM | POA: Diagnosis not present

## 2022-05-03 MED ORDER — FAMOTIDINE 20 MG PO TABS: 20.0000 mg | ORAL_TABLET | Freq: Every day | ORAL | 1 refills | Status: AC

## 2022-05-03 MED ORDER — FERROUS GLUCONATE 324 (38 FE) MG PO TABS: 324.0000 mg | ORAL_TABLET | Freq: Every day | ORAL | 1 refills | Status: DC

## 2022-05-03 MED ORDER — TERBINAFINE HCL 250 MG PO TABS: 250.0000 mg | ORAL_TABLET | Freq: Every day | ORAL | 0 refills | Status: AC

## 2022-05-03 NOTE — Progress Notes (Signed)
Established Patient Office Visit  Subjective   Patient ID: Yesenia Thomas, female    DOB: 1981-01-05  Age: 41 y.o. MRN: [id number]  Chief Complaint  Patient presents with   Follow-up    HPI Patient is here today for follow up.   Iron Deficiency Anemia: -Had been on ferrous gluconate 324 mg daily, discontinued because it was causing GI symptoms -Last hgb 11.2 7/23, MCV 77 -Irregular periods - started earlier this year, having periods every 6 weeks or so. Not heavy, would last about 3-4 days. LMP 2 months ago. Pregnancy test in the ER in July negative, patient not sexually active.  GERD: -Currently on Protonix 20 mg as needed   Recent PNA: -Diagnosed with PNA with chest x-ray on 02/22/22 -Treated with steroids, Amoxicillin and Azithromycin  -Repeated chest x-ray from the ER 04/07/22 showed resolution   Health Maintenance: -Blood work UTD -Mammogram ordered, has not yet been scheduled   Patient Active Problem List   Diagnosis Date Noted   Dizzy spells 08/26/2021   Lumbar back pain with radiculopathy affecting right lower extremity 08/26/2021   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   Dysthymia 08/26/2021   Severe obesity (BMI >= 40) (HCC) 07/20/2017   Iron deficiency anemia 08/02/2013   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   GERD (gastroesophageal reflux disease) 04/17/2013   Past Medical History:  Diagnosis Date   ADHD    Anxiety    Chlamydia    Depression    GERD (gastroesophageal reflux disease)    Iron deficiency anemia    Morbid obesity due to excess calories (HCC)    PPD positive    Pregnancy induced hypertension    Trauma    Varicella    Past Surgical History:  Procedure Laterality Date   CESAREAN SECTION     Social History   Tobacco Use   Smoking status: Never   Smokeless tobacco: Never  Vaping Use   Vaping Use: Never used  Substance Use Topics   Alcohol use: Not Currently   Drug use: Not Currently    Types: Marijuana   Social History    Socioeconomic History   Marital status: Unknown    Spouse name: Not on file   Number of children: Not on file   Years of education: Not on file   Highest education level: Not on file  Occupational History   Not on file  Tobacco Use   Smoking status: Never   Smokeless tobacco: Never  Vaping Use   Vaping Use: Never used  Substance and Sexual Activity   Alcohol use: Not Currently   Drug use: Not Currently    Types: Marijuana   Sexual activity: Yes  Other Topics Concern   Not on file  Social History Narrative   Not on file   Social Determinants of Health   Financial Resource Strain: Not on file  Food Insecurity: Not on file  Transportation Needs: Not on file  Physical Activity: Not on file  Stress: Not on file  Social Connections: Not on file  Intimate Partner Violence: Not on file   Family Status  Relation Name Status   Mother  Alive   Father  Alive   MGM  (Not Specified)   MGF  (Not Specified)   PGM  Deceased   Family History  Problem Relation Age of Onset   Hypertension Mother    Thrombosis Maternal Grandmother    Clotting disorder Maternal Grandfather    Deep vein thrombosis Maternal Grandfather    Heart  attack Paternal Grandmother    Allergies  Allergen Reactions   Lactose Intolerance (Gi)    Percocet [Oxycodone-Acetaminophen]       Review of Systems  Constitutional:  Negative for chills and fever.  Eyes:  Negative for blurred vision.  Respiratory:  Negative for shortness of breath.   Cardiovascular:  Negative for chest pain.      Objective:     Pulse (!) 108   Temp 98.2 F (36.8 C)   Resp 20   Ht 5\' 6"  (1.676 m)   Wt (!) 374 lb 3.2 oz (169.7 kg)   LMP  (LMP Unknown)   SpO2 97%   BMI 60.40 kg/m  BP Readings from Last 3 Encounters:  05/03/22 126/82  04/07/22 137/85  02/22/22 126/78   Wt Readings from Last 3 Encounters:  05/03/22 (!) 374 lb 3.2 oz (169.7 kg)  04/07/22 (!) 367 lb (166.5 kg)  02/22/22 (!) 366 lb 1.6 oz (166.1 kg)       Physical Exam Constitutional:      Appearance: Normal appearance. She is obese.  HENT:     Head: Normocephalic and atraumatic.  Eyes:     Conjunctiva/sclera: Conjunctivae normal.  Cardiovascular:     Rate and Rhythm: Normal rate and regular rhythm.  Pulmonary:     Effort: Pulmonary effort is normal.     Breath sounds: Normal breath sounds. No wheezing, rhonchi or rales.  Musculoskeletal:     Right lower leg: No edema.     Left lower leg: No edema.  Skin:    General: Skin is warm and dry.  Neurological:     General: No focal deficit present.     Mental Status: She is alert. Mental status is at baseline.  Psychiatric:        Mood and Affect: Mood normal.        Behavior: Behavior normal.      No results found for any visits on 05/03/22.  Last CBC Lab Results  Component Value Date   WBC 8.2 04/07/2022   HGB 11.2 (L) 04/07/2022   HCT 37.6 04/07/2022   MCV 77.2 (L) 04/07/2022   MCH 23.0 (L) 04/07/2022   RDW 19.2 (H) 04/07/2022   PLT 326 04/07/2022   Last metabolic panel Lab Results  Component Value Date   GLUCOSE 90 04/07/2022   NA 139 04/07/2022   K 3.9 04/07/2022   CL 108 04/07/2022   CO2 25 04/07/2022   BUN 10 04/07/2022   CREATININE 0.86 04/07/2022   GFRNONAA >60 04/07/2022   CALCIUM 8.9 04/07/2022   PROT 7.2 04/07/2022   ALBUMIN 3.7 04/07/2022   BILITOT 0.3 04/07/2022   ALKPHOS 47 04/07/2022   AST 17 04/07/2022   ALT 14 04/07/2022   ANIONGAP 6 04/07/2022   Last lipids Lab Results  Component Value Date   CHOL 168 12/30/2021   HDL 39 (L) 12/30/2021   LDLCALC 108 (H) 12/30/2021   TRIG 114 12/30/2021   CHOLHDL 4.3 12/30/2021   Last hemoglobin A1c Lab Results  Component Value Date   HGBA1C 5.6 09/16/2021   Last thyroid functions Lab Results  Component Value Date   TSH 1.58 09/16/2021   Last vitamin D No results found for: "25OHVITD2", "25OHVITD3", "VD25OH" Last vitamin B12 and Folate Lab Results  Component Value Date   VITAMINB12  355 02/24/2021   FOLATE 13.9 02/24/2021      The 10-year ASCVD risk score (Arnett DK, et al., 2019) is: 1.7%    Assessment &  Plan:   1. Iron deficiency anemia, unspecified iron deficiency anemia type: Recheck CBC, iron panel today. Discussed restarting ferrous gluconate 324 mg every other day to help with side effects. Discussed taking it with Vitamin C to increase absorption.   - CBC w/Diff/Platelet - Fe+TIBC+Fer - ferrous gluconate (FERGON) 324 MG tablet; Take 1 tablet (324 mg total) by mouth daily.  Dispense: 90 tablet; Refill: 1  2. Irregular periods: Pregnancy negative, not sexually active. Check TSH and prolactin with above labs. May require referral to Gynecology.   - Prolactin - TSH  3. Gastroesophageal reflux disease, unspecified whether esophagitis present: Stable, refill Pepcid 20 mg to take as needed.   - famotidine (PEPCID) 20 MG tablet; Take 1 tablet (20 mg total) by mouth daily.  Dispense: 90 tablet; Refill: 1  4. Class 3 severe obesity due to excess calories with serious comorbidity and body mass index (BMI) of 60.0 to 69.9 in adult [facility]): Wants to discuss medications for weight loss, check A1c today. Patient directed to call insurance to find out about weight loss medication coverage.   - HgB A1c   Return in about 6 months (around 11/03/2022).    Margarita Mail, DO

## 2022-05-03 NOTE — Progress Notes (Signed)
  Subjective:  Patient ID: Yesenia Thomas, female    DOB: 1981/03/28,  MRN: [id number]  Chief Complaint  Patient presents with   Nail Problem    "My right toenail has turned brown."  N - toenail is brown L - hallux right D - 6 mos O - gradually C - brown, thick, hard to cut A - none T - none    41 y.o. female presents with the above complaint. History confirmed with patient.   Objective:  Physical Exam: warm, good capillary refill, no trophic changes or ulcerative lesions, normal DP and PT pulses, and normal sensory exam. Left Foot: dystrophic yellowed discolored nail plates with subungual debris Right Foot: dystrophic yellowed discolored nail plates with subungual debris  Assessment:   1. Onychomycosis   2. Pain due to onychomycosis of toenails of both feet      Plan:  Patient was evaluated and treated and all questions answered.  Discussed the etiology and treatment options for the condition in detail with the patient. Educated patient on the topical and oral treatment options for mycotic nails. Recommended debridement of the nails today. Sharp and mechanical debridement performed of all painful and mycotic nails today. Nails debrided in length and thickness using a nail nipper to level of comfort. Discussed treatment options including appropriate shoe gear. Follow up as needed for painful nails.  She is interested in treating medically as well.  I recommended 90-day course of Lamisil.  Use and possible side effects were reviewed with her.  This was sent to her pharmacy and I will see her back in 4 months for reevaluation    Return in about 4 months (around 09/02/2022).

## 2022-05-03 NOTE — Patient Instructions (Addendum)
It was great seeing you today!  Plan discussed at today's visit: -Blood work ordered today, results will be uploaded to MyChart. If work up is negative will refer to Gynecology -Continue iron supplements - take every other day and take with Vitamin C to help with absorption  -Pepcid refilled -Need to wear compression stockings during the day - recommend [facility] in [place] to be measured for customized compression stockings  -Call insurance company about coverage for either Wegovy or Ozempic for weight loss  Follow up in: 6 months   Take care and let us know if you have any questions or concerns prior to your next visit.  Dr. Caralee Ates

## 2022-05-04 LAB — CBC WITH DIFFERENTIAL/PLATELET
Absolute Monocytes: 797 cells/uL (ref 200–950)
Basophils Absolute: 38 cells/uL (ref 0–200)
Basophils Relative: 0.4 %
Eosinophils Absolute: 202 cells/uL (ref 15–500)
Eosinophils Relative: 2.1 %
HCT: 36.7 % (ref 35.0–45.0)
Hemoglobin: 11.3 g/dL — ABNORMAL LOW (ref 11.7–15.5)
Lymphs Abs: 2938 cells/uL (ref 850–3900)
MCH: 24 pg — ABNORMAL LOW (ref 27.0–33.0)
MCHC: 30.8 g/dL — ABNORMAL LOW (ref 32.0–36.0)
MCV: 77.9 fL — ABNORMAL LOW (ref 80.0–100.0)
MPV: 9.3 fL (ref 7.5–12.5)
Monocytes Relative: 8.3 %
Neutro Abs: 5626 cells/uL (ref 1500–7800)
Neutrophils Relative %: 58.6 %
Platelets: 317 10*3/uL (ref 140–400)
RBC: 4.71 10*6/uL (ref 3.80–5.10)
RDW: 17.3 % — ABNORMAL HIGH (ref 11.0–15.0)
Total Lymphocyte: 30.6 %
WBC: 9.6 10*3/uL (ref 3.8–10.8)

## 2022-05-04 LAB — PROLACTIN: Prolactin: 5.8 ng/mL

## 2022-05-04 LAB — HEMOGLOBIN A1C
Hgb A1c MFr Bld: 5.6 % of total Hgb (ref <5.7)
Mean Plasma Glucose: 114 mg/dL
eAG (mmol/L): 6.3 mmol/L

## 2022-05-04 LAB — IRON,TIBC AND FERRITIN PANEL
%SAT: 7 % (calc) — ABNORMAL LOW (ref 16–45)
Ferritin: 13 ng/mL — ABNORMAL LOW (ref 16–232)
Iron: 22 ug/dL — ABNORMAL LOW (ref 40–190)
TIBC: 313 mcg/dL (calc) (ref 250–450)

## 2022-05-04 LAB — TSH: TSH: 0.52 mIU/L

## 2022-05-04 NOTE — Addendum Note (Signed)
Addended by: Margarita Mail on: 05/04/2022 10:47 AM   Modules accepted: Orders

## 2022-05-28 ENCOUNTER — Telehealth: Payer: Self-pay | Admitting: Obstetrics and Gynecology

## 2022-05-28 NOTE — Telephone Encounter (Signed)
[facility] referring for amenorrhea and irregular periods. Sch with any provider. I contacted patient via phone. I left voicemail for patient to call back to be scheduled.

## 2022-06-03 NOTE — Telephone Encounter (Signed)
Patient is scheduled for 06/14/22 at 9:15 am with CJE

## 2022-06-09 ENCOUNTER — Encounter (INDEPENDENT_AMBULATORY_CARE_PROVIDER_SITE_OTHER): Payer: Self-pay

## 2022-06-14 ENCOUNTER — Other Ambulatory Visit (HOSPITAL_COMMUNITY)
Admission: RE | Admit: 2022-06-14 | Discharge: 2022-06-14 | Disposition: A | Discharge status: Home / Self Care | Payer: Medicaid Other | Source: Ambulatory Visit | Attending: Obstetrics & Gynecology | Admitting: Obstetrics & Gynecology

## 2022-06-14 ENCOUNTER — Encounter: Payer: Self-pay | Admitting: Obstetrics & Gynecology

## 2022-06-14 ENCOUNTER — Ambulatory Visit (INDEPENDENT_AMBULATORY_CARE_PROVIDER_SITE_OTHER): Payer: Medicaid Other | Admitting: Obstetrics & Gynecology

## 2022-06-14 VITALS — BP 129/83 | HR 75 | Wt 357.3 lb

## 2022-06-14 DIAGNOSIS — N760 Acute vaginitis: Secondary | ICD-10-CM | POA: Diagnosis not present

## 2022-06-14 DIAGNOSIS — Z01419 Encounter for gynecological examination (general) (routine) without abnormal findings: Secondary | ICD-10-CM | POA: Diagnosis not present

## 2022-06-14 DIAGNOSIS — Z113 Encounter for screening for infections with a predominantly sexual mode of transmission: Secondary | ICD-10-CM

## 2022-06-14 DIAGNOSIS — N926 Irregular menstruation, unspecified: Secondary | ICD-10-CM

## 2022-06-14 NOTE — Progress Notes (Signed)
Subjective:     Yesenia Thomas is a 41 y.o. female here for a routine exam.  Current complaints: patient comes to our office referred from [facility] with complaints of amenorrhea and irregular menstrual cycle since May/April 2023 .  Personal health questionnaire reviewed: yes.   Gynecologic History No LMP recorded. Contraception: abstinence Last Pap: 12/30/2021. Results were: normal   Obstetric History OB History  No obstetric history on file.     The following portions of the patient's history were reviewed and updated as appropriate: allergies, current medications, past family history, past medical history, past social history, past surgical history, and problem list.  Review of Systems Pertinent items are noted in HPI.    Objective:    BP 129/83   Pulse 75   Wt (!) 357 lb 4.8 oz (162.1 kg)   LMP 06/07/2022   BMI 57.67 kg/m  General appearance: alert, cooperative, no distress, and morbidly obese Abdomen: normal findings: no organomegaly, soft, non-tender, and morbidly obese Pelvic: cervix normal in appearance, external genitalia normal, no adnexal masses or tenderness, no cervical motion tenderness, uterus normal size, shape, and consistency, and vagina normal without discharge Extremities: extremities normal, atraumatic, no cyanosis or edema Skin: Skin color, texture, turgor normal. No rashes or lesions    Assessment:    Healthy female exam.   Irregular Menses Plan:  Keep symptom diary, CBC. FSH/LH  Contraception: abstinence. Follow up in: 2 weeks.   Rosario Adie, MD  06/14/2022 2:46 PM

## 2022-06-15 LAB — CERVICOVAGINAL ANCILLARY ONLY
Bacterial Vaginitis (gardnerella): NEGATIVE
Candida Glabrata: NEGATIVE
Candida Vaginitis: NEGATIVE
Chlamydia: NEGATIVE
Comment: NEGATIVE
Comment: NEGATIVE
Comment: NEGATIVE
Comment: NEGATIVE
Comment: NEGATIVE
Comment: NORMAL
Neisseria Gonorrhea: NEGATIVE
Trichomonas: NEGATIVE

## 2022-06-15 LAB — HEP, RPR, HIV PANEL
HIV Screen 4th Generation wRfx: NONREACTIVE
Hepatitis B Surface Ag: NEGATIVE
RPR Ser Ql: NONREACTIVE

## 2022-06-15 LAB — CBC WITH DIFFERENTIAL/PLATELET
Basophils Absolute: 0 10*3/uL (ref 0.0–0.2)
Basos: 0 %
EOS (ABSOLUTE): 0.2 10*3/uL (ref 0.0–0.4)
Eos: 2 %
Hematocrit: 38.9 % (ref 34.0–46.6)
Hemoglobin: 11.8 g/dL (ref 11.1–15.9)
Immature Grans (Abs): 0 10*3/uL (ref 0.0–0.1)
Immature Granulocytes: 0 %
Lymphocytes Absolute: 2.4 10*3/uL (ref 0.7–3.1)
Lymphs: 26 %
MCH: 23.7 pg — ABNORMAL LOW (ref 26.6–33.0)
MCHC: 30.3 g/dL — ABNORMAL LOW (ref 31.5–35.7)
MCV: 78 fL — ABNORMAL LOW (ref 79–97)
Monocytes Absolute: 0.8 10*3/uL (ref 0.1–0.9)
Monocytes: 9 %
Neutrophils Absolute: 5.9 10*3/uL (ref 1.4–7.0)
Neutrophils: 63 %
Platelets: 280 10*3/uL (ref 150–450)
RBC: 4.97 x10E6/uL (ref 3.77–5.28)
RDW: 16.8 % — ABNORMAL HIGH (ref 11.7–15.4)
WBC: 9.2 10*3/uL (ref 3.4–10.8)

## 2022-06-15 LAB — FSH/LH
FSH: 38.6 m[IU]/mL
LH: 24.7 m[IU]/mL

## 2022-07-21 ENCOUNTER — Ambulatory Visit
Admission: RE | Admit: 2022-07-21 | Discharge: 2022-07-21 | Disposition: A | Discharge status: Home / Self Care | Payer: Medicaid Other | Source: Ambulatory Visit | Attending: Internal Medicine | Admitting: Internal Medicine

## 2022-07-21 DIAGNOSIS — Z1231 Encounter for screening mammogram for malignant neoplasm of breast: Secondary | ICD-10-CM | POA: Diagnosis present

## 2022-07-23 ENCOUNTER — Ambulatory Visit (INDEPENDENT_AMBULATORY_CARE_PROVIDER_SITE_OTHER): Payer: Medicaid Other | Admitting: Family Medicine

## 2022-07-23 ENCOUNTER — Encounter: Payer: Self-pay | Admitting: Family Medicine

## 2022-07-23 VITALS — BP 128/80 | HR 98 | Temp 98.2°F | Resp 16 | Ht 66.0 in | Wt 348.7 lb

## 2022-07-23 DIAGNOSIS — J069 Acute upper respiratory infection, unspecified: Secondary | ICD-10-CM

## 2022-07-23 DIAGNOSIS — J40 Bronchitis, not specified as acute or chronic: Secondary | ICD-10-CM

## 2022-07-23 DIAGNOSIS — Z8701 Personal history of pneumonia (recurrent): Secondary | ICD-10-CM | POA: Diagnosis not present

## 2022-07-23 MED ORDER — CETIRIZINE HCL 10 MG PO TABS: 10.0000 mg | ORAL_TABLET | Freq: Every day | ORAL | 11 refills | Status: DC

## 2022-07-23 MED ORDER — FLUTICASONE PROPIONATE 50 MCG/ACT NA SUSP: 2.0000 | Freq: Every day | NASAL | 6 refills | Status: DC

## 2022-07-23 MED ORDER — DOXYCYCLINE HYCLATE 100 MG PO TABS: 100.0000 mg | ORAL_TABLET | Freq: Two times a day (BID) | ORAL | 0 refills | Status: AC

## 2022-07-23 MED ORDER — PREDNISONE 20 MG PO TABS: ORAL_TABLET | ORAL | 0 refills | Status: DC

## 2022-07-23 NOTE — Progress Notes (Signed)
Patient ID: Yesenia Thomas, female    DOB: 20-Aug-1981, 41 y.o.   MRN: [id number]  PCP: Teodora Medici, DO  Chief Complaint  Patient presents with   Cough    Onset for over 2 weeks, pt feels like choking in the mornings due to a lot pf phlegm/mucus.   Nasal Congestion    Subjective:   Yesenia Thomas is a 41 y.o. female, presents to clinic with CC of the following:  HPI  Nasal congestion and cough x 2 weeks, worse in the AM so much coughing/choking on mucous, no vomiting Productive cough can be brown thick mucous  No CP, SOB, wheeze, fever, chills sweats  In the past got inhaler in the setting of pneumonia Reviewed chart it appears that she had a abnormal chest x-ray and possible pneumonia about 4-5 months ago a subsequent x-ray was clear She denies any childhood asthma She is most concerned with getting testing to figure out why she is sick she is also concerned that she "could not breathe" but she explains that her nose was so congested she could not breathe in through her nose  Patient Active Problem List   Diagnosis Date Noted   Dizzy spells 08/26/2021   Lumbar back pain with radiculopathy affecting right lower extremity 08/26/2021   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   Dysthymia 08/26/2021   Severe obesity (BMI >= 40) ([place]) 07/20/2017   Iron deficiency anemia 08/02/2013   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   GERD (gastroesophageal reflux disease) 04/17/2013      Current Outpatient Medications:    Acetaminophen 500 MG coapsule, Take by mouth as needed., Disp: , Rfl:    Ashwagandha 120 MG CAPS, Take 1 capsule by mouth daily., Disp: , Rfl:    cyanocobalamin (VITAMIN B12) 1000 MCG tablet, Take 3,000 mcg by mouth daily., Disp: , Rfl:    famotidine (PEPCID) 20 MG tablet, Take 1 tablet (20 mg total) by mouth daily., Disp: 90 tablet, Rfl: 1   Turmeric (QC TUMERIC COMPLEX) 500 MG CAPS, Take by mouth., Disp: , Rfl:    Accu-Chek Softclix Lancets lancets, Use as  instructed (Patient not taking: Reported on 06/14/2022), Disp: 100 each, Rfl: 12   albuterol (VENTOLIN HFA) 108 (90 Base) MCG/ACT inhaler, Inhale 2 puffs into the lungs every 6 (six) hours as needed for wheezing or shortness of breath. (Patient not taking: Reported on 06/14/2022), Disp: 8 g, Rfl: 0   blood glucose meter kit and supplies KIT, Dispense based on patient and insurance preference. Use up to four times daily as directed. (Patient not taking: Reported on 06/14/2022), Disp: 1 each, Rfl: 0   ferrous gluconate (FERGON) 324 MG tablet, Take 1 tablet (324 mg total) by mouth daily. (Patient not taking: Reported on 06/14/2022), Disp: 90 tablet, Rfl: 1   glucose blood test strip, Use as instructed (Patient not taking: Reported on 06/14/2022), Disp: 100 each, Rfl: 12   terbinafine (LAMISIL) 250 MG tablet, Take 1 tablet (250 mg total) by mouth daily. (Patient not taking: Reported on 06/14/2022), Disp: 90 tablet, Rfl: 0   Allergies  Allergen Reactions   Lactose Intolerance (Gi)    Percocet [Oxycodone-Acetaminophen]      Social History   Tobacco Use   Smoking status: Never   Smokeless tobacco: Never  Vaping Use   Vaping Use: Never used  Substance Use Topics   Alcohol use: Not Currently   Drug use: Not Currently    Types: Marijuana      Chart Review Today:  I personally reviewed active problem list, medication list, allergies, family history, social history, health maintenance, notes from last encounter, lab results, imaging with the patient/caregiver today.   Review of Systems  Constitutional: Negative.   HENT: Negative.    Eyes: Negative.   Respiratory: Negative.    Cardiovascular: Negative.   Gastrointestinal: Negative.   Endocrine: Negative.   Genitourinary: Negative.   Musculoskeletal: Negative.   Skin: Negative.   Allergic/Immunologic: Negative.   Neurological: Negative.   Hematological: Negative.   Psychiatric/Behavioral: Negative.    All other systems reviewed and are  negative.      Objective:   Vitals:   07/23/22 1029 07/23/22 1054  BP: (!) 150/86 128/80  Pulse: 98   Resp: 16   Temp: 98.2 F (36.8 C)   TempSrc: Oral   SpO2: 99%   Weight: (!) 348 lb 11.2 oz (158.2 kg)   Height: _0  (1.676 m)     Body mass index is 56.28 kg/m.  Physical Exam Vitals and nursing note reviewed.  Constitutional:      Appearance: Normal appearance. She is well-developed and well-groomed. She is morbidly obese. She is not ill-appearing, toxic-appearing or diaphoretic.  HENT:     Head: Normocephalic and atraumatic.     Right Ear: Tympanic membrane, ear canal and external ear normal. There is no impacted cerumen.     Left Ear: Tympanic membrane, ear canal and external ear normal. There is no impacted cerumen.     Nose: Mucosal edema, congestion and rhinorrhea present.     Right Turbinates: Enlarged and swollen.     Left Turbinates: Swollen.     Right Sinus: No maxillary sinus tenderness or frontal sinus tenderness.     Left Sinus: No maxillary sinus tenderness or frontal sinus tenderness.     Mouth/Throat:     Mouth: Mucous membranes are moist.     Pharynx: Oropharynx is clear. Posterior oropharyngeal erythema (mild) present. No pharyngeal swelling, oropharyngeal exudate or uvula swelling.     Tonsils: No tonsillar exudate or tonsillar abscesses. 2+ on the right. 2+ on the left.  Eyes:     General: No scleral icterus.       Right eye: No discharge.        Left eye: No discharge.     Conjunctiva/sclera: Conjunctivae normal.  Cardiovascular:     Rate and Rhythm: Normal rate and regular rhythm.     Pulses: Normal pulses.     Heart sounds: Normal heart sounds.  Pulmonary:     Effort: Pulmonary effort is normal. No tachypnea, accessory muscle usage, respiratory distress or retractions.     Breath sounds: No stridor. Decreased breath sounds present. No wheezing, rhonchi or rales.     Comments: Exam limited by body habitus, multiple attempts to improve her  inspiratory effort but breath sounds throughout are difficult to hear there was no audible wheeze rales or rhonchi she is able to speak in full and complete sentences Abdominal:     General: Bowel sounds are normal.     Palpations: Abdomen is soft.  Lymphadenopathy:     Cervical: No cervical adenopathy.  Neurological:     Mental Status: She is alert.  Psychiatric:        Behavior: Behavior is cooperative.      Results for orders placed or performed in visit on 06/14/22  FSH/LH  Result Value Ref Range   LH 24.7 mIU/mL   FSH 38.6 mIU/mL  CBC w/Diff  Result Value Ref Range  WBC 9.2 3.4 - 10.8 x10E3/uL   RBC 4.97 3.77 - 5.28 x10E6/uL   Hemoglobin 11.8 11.1 - 15.9 g/dL   Hematocrit 38.9 34.0 - 46.6 %   MCV 78 (L) 79 - 97 fL   MCH 23.7 (L) 26.6 - 33.0 pg   MCHC 30.3 (L) 31.5 - 35.7 g/dL   RDW 16.8 (H) 11.7 - 15.4 %   Platelets 280 150 - 450 x10E3/uL   Neutrophils 63 Not Estab. %   Lymphs 26 Not Estab. %   Monocytes 9 Not Estab. %   Eos 2 Not Estab. %   Basos 0 Not Estab. %   Neutrophils Absolute 5.9 1.4 - 7.0 x10E3/uL   Lymphocytes Absolute 2.4 0.7 - 3.1 x10E3/uL   Monocytes Absolute 0.8 0.1 - 0.9 x10E3/uL   EOS (ABSOLUTE) 0.2 0.0 - 0.4 x10E3/uL   Basophils Absolute 0.0 0.0 - 0.2 x10E3/uL   Immature Granulocytes 0 Not Estab. %   Immature Grans (Abs) 0.0 0.0 - 0.1 x10E3/uL  HEP, RPR, HIV Panel  Result Value Ref Range   Hepatitis B Surface Ag Negative Negative   RPR Ser Ql Non Reactive Non Reactive   HIV Screen 4th Generation wRfx Non Reactive Non Reactive  Cervicovaginal ancillary only  Result Value Ref Range   Neisseria Gonorrhea Negative    Chlamydia Negative    Trichomonas Negative    Bacterial Vaginitis (gardnerella) Negative    Candida Vaginitis Negative    Candida Glabrata Negative    Comment      Normal Reference Range Bacterial Vaginosis - Negative   Comment Normal Reference Range Candida Species - Negative    Comment Normal Reference Range Candida Galbrata  - Negative    Comment Normal Reference Range Trichomonas - Negative    Comment Normal Reference Ranger Chlamydia - Negative    Comment      Normal Reference Range Neisseria Gonorrhea - Negative       Assessment & Plan:   Patient is a 41 year old female presents with more than 2 weeks of nasal congestion, discharge, sore throat, postnasal drip and coughing She did recently have pneumonia, prior has no history of asthma or bronchitis In the setting of 2 weeks of symptoms and recent pneumonia will cover with doxycycline for possible sinusitis and any atypical pneumonias she was also given prednisone and she was encouraged to start a intranasal steroid spray and antihistamine since most of her symptoms are with the congestion and swelling in her nasal mucosa which is very edematous today and looks very congested I explained that she also could use over-the-counter medications such as Delsym or Robitussin and Mucinex  She did ask for testing however explained that her presentation is not consistent with flu or strep and if she had COVID or any other viral respiratory illness it would not change her management because her symptoms began more than 2 weeks ago.    ICD-10-CM   1. Upper respiratory tract infection, unspecified type  J06.9 fluticasone (FLONASE) 50 MCG/ACT nasal spray    cetirizine (ZYRTEC) 10 MG tablet    doxycycline (VIBRA-TABS) 100 MG tablet    2. Bronchitis  J40 predniSONE (DELTASONE) 20 MG tablet    doxycycline (VIBRA-TABS) 100 MG tablet    3. History of community acquired pneumonia  Z87.01 doxycycline (VIBRA-TABS) 100 MG tablet      She was encouraged to follow-up if there is any worsening of her symptoms or not improved int he next 2-4 weeks - uri/bronchitis can have sx that  are prolonged for days to weeks.  She may have some allergy component as well which is why I asked her to take the Zyrtec and Flonase for a while after she is done with her antibiotics     Delsa Grana, PA-C 07/23/22 10:35 AM

## 2022-07-23 NOTE — Patient Instructions (Addendum)
I can send in antihistamines and intranasal steroid sprays - those both will work gradually over a week or two to dry you and shrink down the swollen tissue and help with congestion With your coughing I think you have mild bronchitis and I'm sending in prednisone and I want you to use your albuterol inhaler  You can take over the counter delsym or robitussin and also do mucinex and push a lot of fluids  I am sending in an antibiotic that would treat and cover sinus infection and atypical pneumonia since you've had symptoms for over 2 weeks.

## 2022-09-01 ENCOUNTER — Ambulatory Visit (INDEPENDENT_AMBULATORY_CARE_PROVIDER_SITE_OTHER): Payer: Medicaid Other | Admitting: Podiatry

## 2022-09-01 DIAGNOSIS — Z91199 Patient's noncompliance with other medical treatment and regimen due to unspecified reason: Secondary | ICD-10-CM

## 2022-09-01 NOTE — Progress Notes (Signed)
Patient was no-show for appointment today 

## 2022-10-15 ENCOUNTER — Ambulatory Visit: Payer: Self-pay | Admitting: *Deleted

## 2022-10-15 NOTE — Telephone Encounter (Signed)
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 Patient advised she can follow up with health dept or PCP regarding testing recommendations. Normally- if screening questions are negative then no further follow is needed unless there is new exposure  or work in  high risk area. Patient also wants to have an MRI - patient states she has chronic pain and has not been able to get diagnosis. Patient advised she needs to discuss her symptoms and order of investigation with her provider. An MRI is not usually a "first" test ordered.   Reason for Disposition  [1] Caller requesting NON-URGENT health information AND [2] PCP's office is the best resource  Answer Assessment - Initial Assessment Questions 1. REASON FOR CALL or QUESTION: "What is your reason for calling today?" or "How can I best help you?" or "What question do you have that I can help answer?"     Patient has questions about TB testing and MRI- Patient has appointment on Monday and will discuss with provider then- see notes  Protocols used: Information Only Call - No Triage-A-AH

## 2022-10-18 ENCOUNTER — Ambulatory Visit: Payer: Medicaid Other | Admitting: Internal Medicine

## 2022-10-18 NOTE — Progress Notes (Deleted)
   Acute Office Visit  Subjective:     Patient ID: Yesenia Thomas, female    DOB: 1980/12/29, 42 y.o.   MRN: [id number]  No chief complaint on file.   HPI Patient is in today for hip pain.   HIP PAIN Duration: {Blank single:19197::"chronic","days","weeks","months"} Involved hip: {Blank single:19197::"left","right","bilateral"}  Mechanism of injury: {Blank single:19197::"trauma","unknown"} Location: {Blank single:19197::"anterior","posterior","lateral","medial","diffuse"} Onset: {Blank single:19197::"sudden","gradual"}  Severity: {Blank single:19197::"mild","moderate","severe","1/10","2/10","3/10","4/10","5/10","6/10","7/10","8/10","9/10","10/10"}  Quality: {Blank multiple:19196::"sharp","dull","aching","burning","cramping","ill-defined","itchy","pressure-like","pulling","shooting","sore","stabbing","tender","tearing","throbbing"} Frequency: {Blank single:19197::"constant","intermittent","occasional","rare","every few minutes","a few times a hour","a few times a day","a few times a week","a few times a month","a few times a year"} Radiation: {Blank single:19197::"yes","no"} Aggravating factors: {Blank multiple:19196::"weight bearing","walking","running","stairs","bending","movement","prolonged sitting"}   Alleviating factors: {Blank multiple:19196::"nothing","ice","physical therapy","HEP","APAP","NSAIDs","crutches","rest"}  Status: {Blank multiple:19196::"better","worse","stable","fluctuating"} Treatments attempted: {Blank multiple:19196::"none","rest","ice","heat","APAP","ibuprofen","aleve","physical therapy","HEP","chiropractor"}   Relief with NSAIDs?: {Blank single:19197::"No NSAIDs Taken","no","mild","moderate","significant"} Weakness with weight bearing: {Blank single:19197::"yes","no"} Weakness with walking: {Blank single:19197::"yes","no"} Paresthesias / decreased sensation: {Blank single:19197::"yes","no"} Swelling: {Blank single:19197::"yes","no"} Redness:{Blank  single:19197::"yes","no"} Fevers: {Blank single:19197::"yes","no"}   ROS      Objective:    There were no vitals taken for this visit. {Vitals History (Optional):23777}  Physical Exam  No results found for any visits on 10/18/22.      Assessment & Plan:   Problem List Items Addressed This Visit   None   No orders of the defined types were placed in this encounter.   No follow-ups on file.  Teodora Medici, DO

## 2022-10-22 ENCOUNTER — Ambulatory Visit (LOCAL_COMMUNITY_HEALTH_CENTER): Payer: Self-pay

## 2022-10-22 DIAGNOSIS — R7611 Nonspecific reaction to tuberculin skin test without active tuberculosis: Secondary | ICD-10-CM

## 2022-10-22 NOTE — Progress Notes (Signed)
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 Pt presents copy of TB screen from [facility]. Copy made and sent for scanning.   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  TB screen completed today and given to pt. Copy sent for scanning. Josie Saunders, RN

## 2022-11-20 IMAGING — CR DG HAND COMPLETE 3+V*R*
1 series · 3 of 3 positions shown · non-contrast
Comparison: None.

CLINICAL DATA: Injury 1 month ago, pain

EXAM:
RIGHT HAND - COMPLETE 3+ VIEW

[Series 1: dg hand complete right · 0.14mm/px · 3 of 3 slices shown]
[im 1/3]
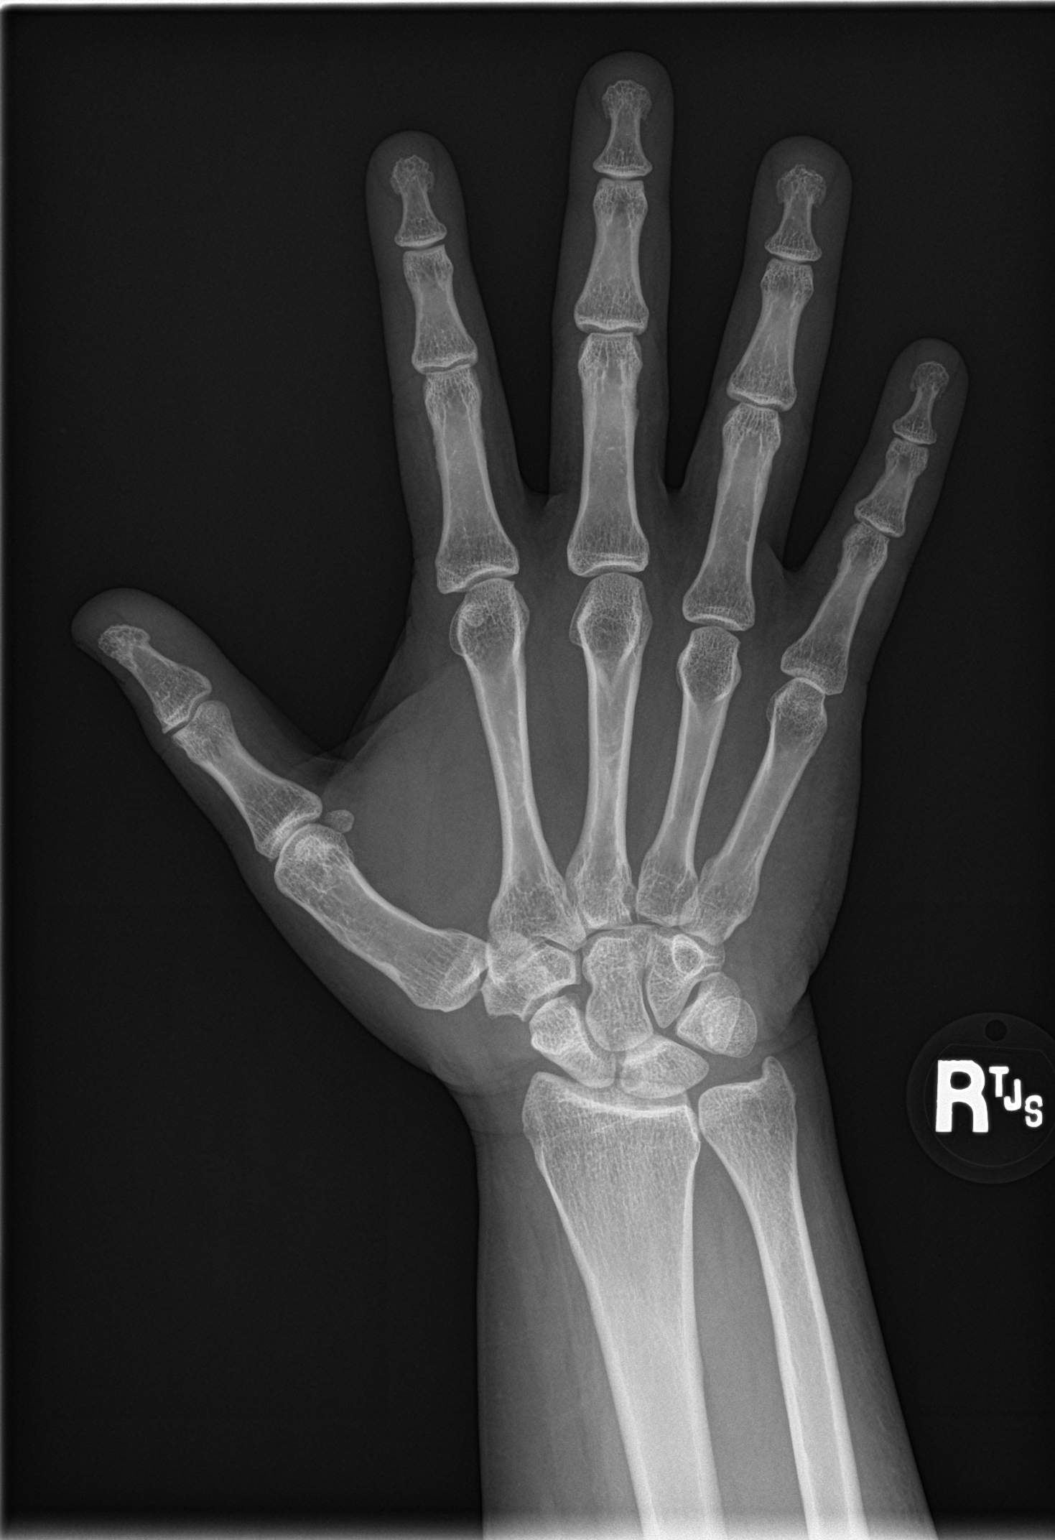
[im 2/3]
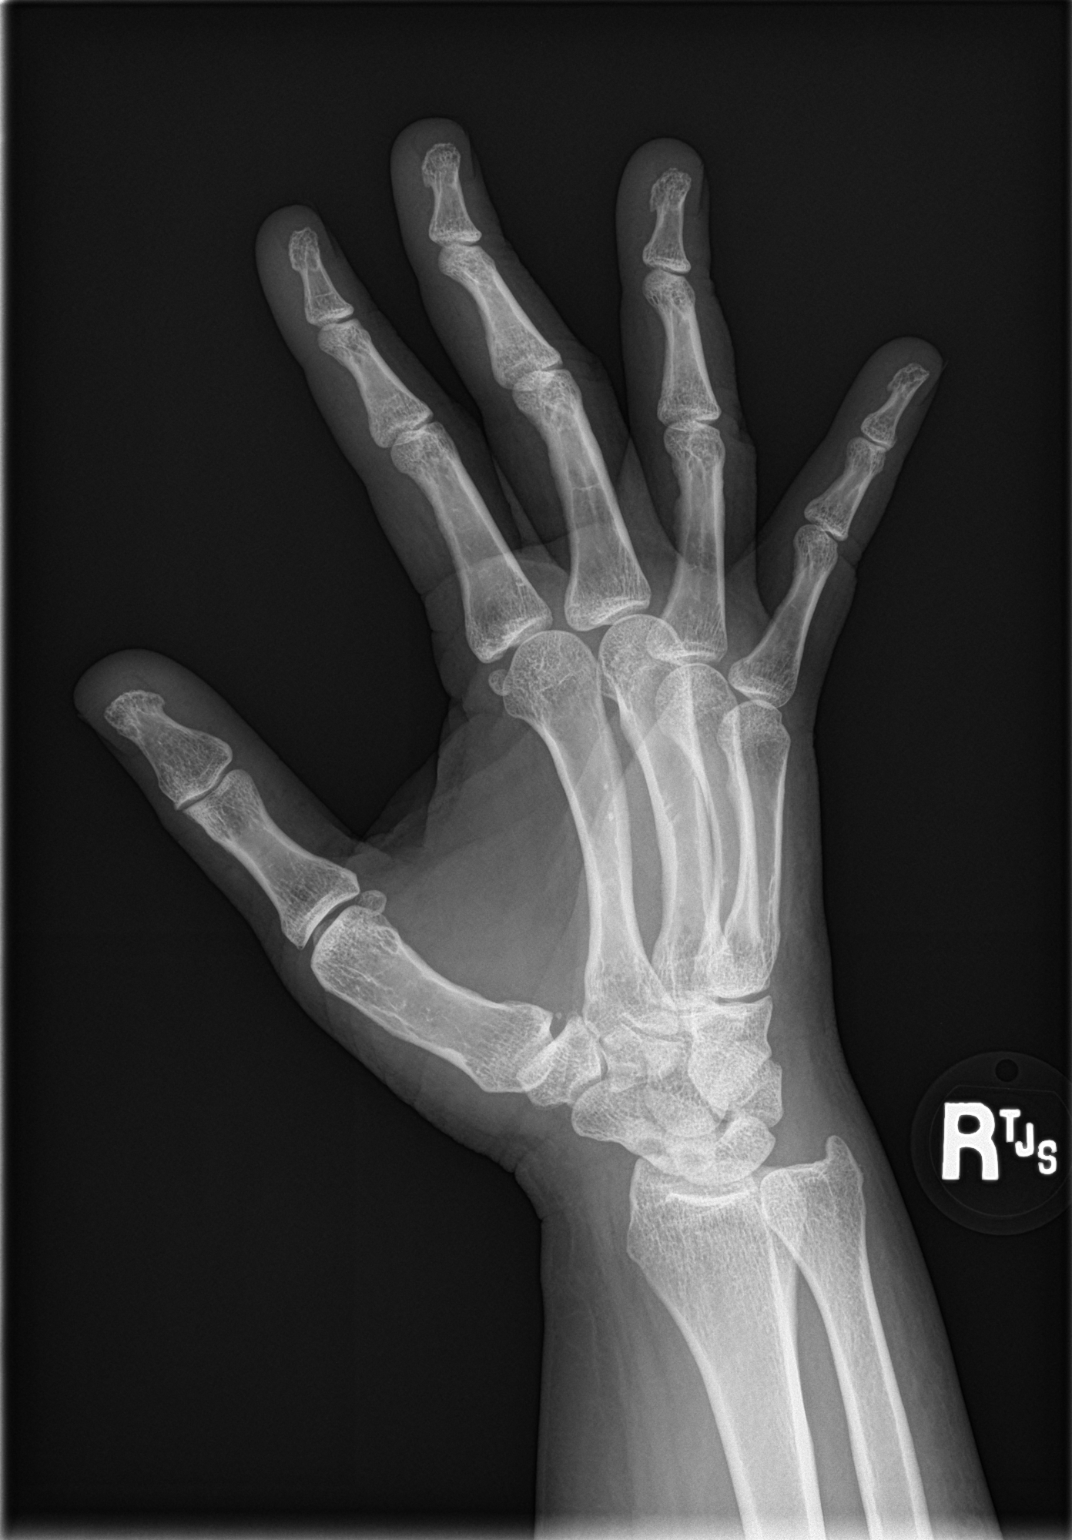
[im 3/3]
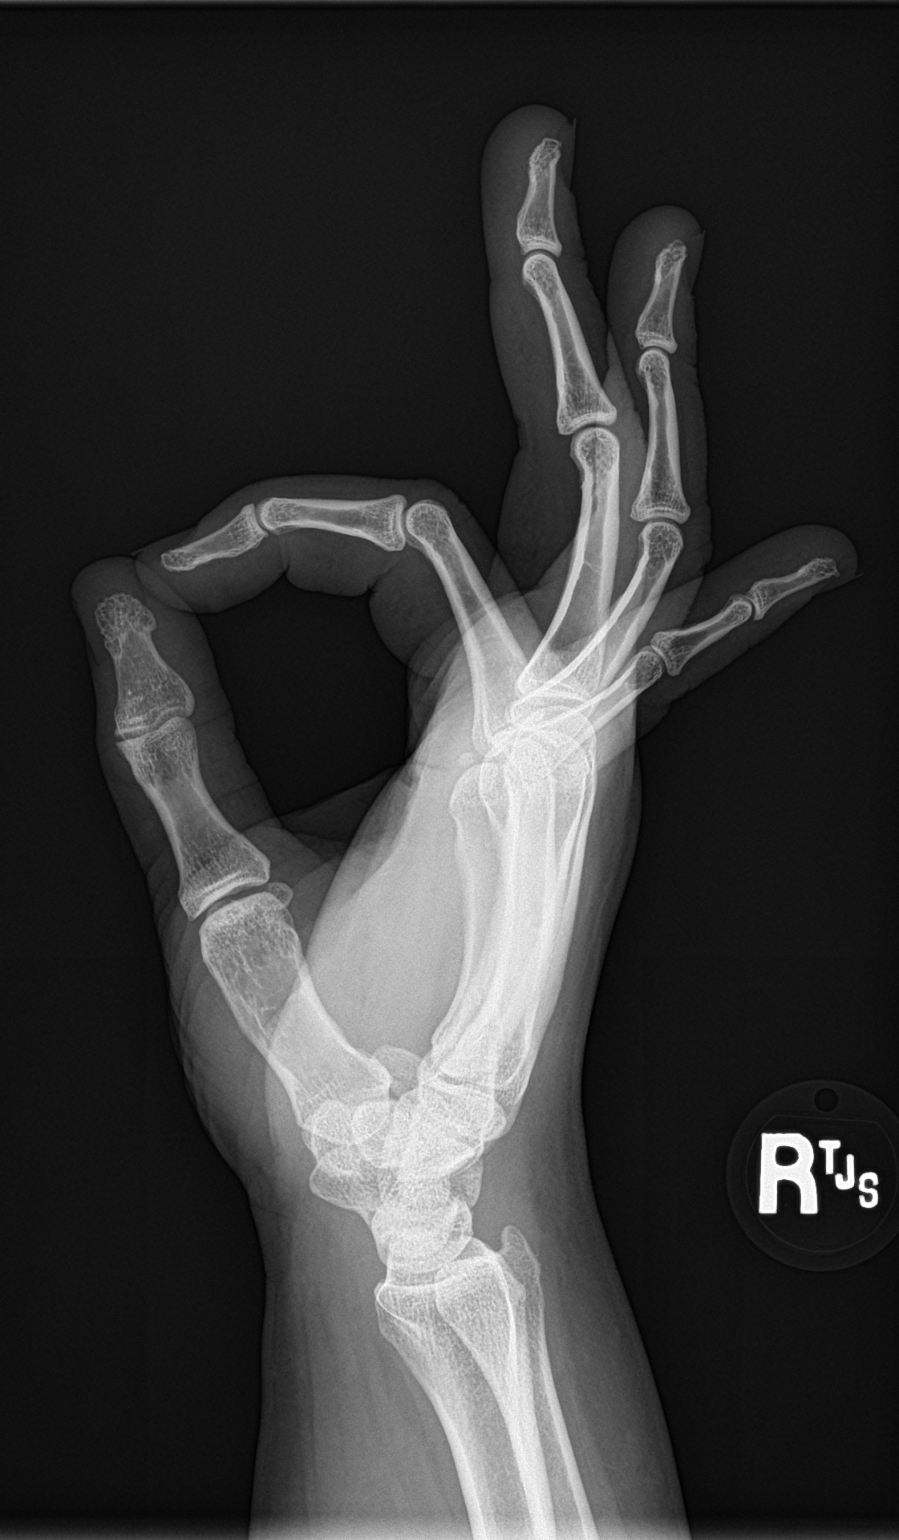

[3 of 3 positions shown; findings below may reference images not displayed]

FINDINGS: Frontal, oblique, and lateral views of the right hand are obtained.
No fracture, subluxation, or dislocation. Joint spaces are well
preserved. Soft tissues are normal.
IMPRESSION: 1. Unremarkable right hand.

## 2022-12-22 ENCOUNTER — Ambulatory Visit: Payer: Medicaid Other | Admitting: Family Medicine

## 2023-03-16 ENCOUNTER — Other Ambulatory Visit: Payer: Self-pay

## 2023-03-16 ENCOUNTER — Emergency Department
Admission: EM | Admit: 2023-03-16 | Discharge: 2023-03-16 | Disposition: A | Discharge status: Home / Self Care | Payer: Medicaid Other | Attending: Emergency Medicine | Admitting: Emergency Medicine

## 2023-03-16 DIAGNOSIS — R197 Diarrhea, unspecified: Secondary | ICD-10-CM | POA: Diagnosis not present

## 2023-03-16 DIAGNOSIS — D72829 Elevated white blood cell count, unspecified: Secondary | ICD-10-CM | POA: Diagnosis not present

## 2023-03-16 DIAGNOSIS — D649 Anemia, unspecified: Secondary | ICD-10-CM | POA: Insufficient documentation

## 2023-03-16 DIAGNOSIS — R112 Nausea with vomiting, unspecified: Secondary | ICD-10-CM | POA: Diagnosis present

## 2023-03-16 HISTORY — DX: Cardiomegaly: I51.7

## 2023-03-16 LAB — COMPREHENSIVE METABOLIC PANEL
ALT: 17 U/L (ref 0–44)
AST: 15 U/L (ref 15–41)
Albumin: 3.6 g/dL (ref 3.5–5.0)
Alkaline Phosphatase: 45 U/L (ref 38–126)
Anion gap: 8 (ref 5–15)
BUN: 9 mg/dL (ref 6–20)
CO2: 24 mmol/L (ref 22–32)
Calcium: 8.6 mg/dL — ABNORMAL LOW (ref 8.9–10.3)
Chloride: 105 mmol/L (ref 98–111)
Creatinine, Ser: 0.71 mg/dL (ref 0.44–1.00)
GFR, Estimated: >60 mL/min (ref >60)
Glucose, Bld: 82 mg/dL (ref 70–99)
Potassium: 3.4 mmol/L — ABNORMAL LOW (ref 3.5–5.1)
Sodium: 137 mmol/L (ref 135–145)
Total Bilirubin: 0.7 mg/dL (ref 0.3–1.2)
Total Protein: 7 g/dL (ref 6.5–8.1)

## 2023-03-16 LAB — C DIFFICILE QUICK SCREEN W PCR REFLEX
C Diff antigen: NEGATIVE
C Diff interpretation: NOT DETECTED
C Diff toxin: NEGATIVE

## 2023-03-16 LAB — GASTROINTESTINAL PANEL BY PCR, STOOL (REPLACES STOOL CULTURE)

## 2023-03-16 LAB — CBC
HCT: 36.8 % (ref 36.0–46.0)
Hemoglobin: 11.1 g/dL — ABNORMAL LOW (ref 12.0–15.0)
MCH: 23.6 pg — ABNORMAL LOW (ref 26.0–34.0)
MCHC: 30.2 g/dL (ref 30.0–36.0)
MCV: 78.3 fL — ABNORMAL LOW (ref 80.0–100.0)
Platelets: 336 10*3/uL (ref 150–400)
RBC: 4.7 MIL/uL (ref 3.87–5.11)
RDW: 18.3 % — ABNORMAL HIGH (ref 11.5–15.5)
WBC: 11.8 10*3/uL — ABNORMAL HIGH (ref 4.0–10.5)
nRBC: 0 % (ref 0.0–0.2)

## 2023-03-16 LAB — URINALYSIS, ROUTINE W REFLEX MICROSCOPIC
Bilirubin Urine: NEGATIVE
Glucose, UA: NEGATIVE mg/dL
Hgb urine dipstick: NEGATIVE
Ketones, ur: NEGATIVE mg/dL
Leukocytes,Ua: NEGATIVE
Nitrite: NEGATIVE
Protein, ur: NEGATIVE mg/dL
Specific Gravity, Urine: 1.016 (ref 1.005–1.030)
pH: 5 (ref 5.0–8.0)

## 2023-03-16 LAB — LIPASE, BLOOD: Lipase: 26 U/L (ref 11–51)

## 2023-03-16 LAB — POC URINE PREG, ED: Preg Test, Ur: NEGATIVE

## 2023-03-16 MED ORDER — ONDANSETRON 4 MG PO TBDP
4.0000 mg | ORAL_TABLET | Freq: Once | ORAL | Status: AC | PRN
  Administered 2023-03-16: 4 mg via ORAL
  Filled 2023-03-16: qty 1

## 2023-03-16 MED ORDER — SODIUM CHLORIDE 0.9 % IV BOLUS
1000.0000 mL | Freq: Once | INTRAVENOUS | Status: AC
  Administered 2023-03-16: 1000 mL via INTRAVENOUS

## 2023-03-16 MED ORDER — ONDANSETRON 4 MG PO TBDP: 4.0000 mg | ORAL_TABLET | Freq: Four times a day (QID) | ORAL | 0 refills | Status: DC | PRN

## 2023-03-16 NOTE — ED Notes (Signed)
Pt estimates vomited x10 and episode of diarrhea x20. Pt works at a nursing home.

## 2023-03-16 NOTE — Discharge Instructions (Addendum)
? ?  Please return to the emergency room right away if you are to develop a fever, severe nausea, your pain becomes severe or worsens, you are unable to keep food down, begin vomiting any dark or bloody fluid, you develop any dark or bloody stools, feel dehydrated, or other new concerns or symptoms arise. ? ?

## 2023-03-16 NOTE — ED Triage Notes (Signed)
Pt reports excessive sweating/feeling "hot" at home and N/V/D x1 day. Pt fatigued in triage. Also c/o HA and dizziness. No vomiting currently noted.

## 2023-03-16 NOTE — ED Notes (Signed)
Pt denies taking tylenol or any similar med today; states "I haven't felt hot today". Has only been eating crackers and drinking gingerale per pt.

## 2023-03-16 NOTE — ED Provider Notes (Signed)
[facility] Provider Note    Event Date/Time   First MD Initiated Contact with Patient 03/16/23 1123     (approximate)   History   Emesis   HPI  Yesenia Thomas is a 42 y.o. female history of depression, iron deficiency LVH,  Yesterday started developing nausea and vomiting.  She started with loose watery stool.  Also, "gurgling" gassy discomfort in her abdomen.  Passing flatus frequently.  The vomiting as well as the stool seem watery no black or bloody.  She reports her stomach does not hurt there is no abdominal pain.  She is not pregnant.  She started today feeling a little bit lightheaded with a mild headache feels "dehydrated"  She estimates that the last 24 hours she probably had around 20 loose watery stools, also vomited about 10 times.  This morning she was able to drink ginger ale and has not had any further vomiting.  She feels like she might need to have some or diarrhea but it seems to be tapering off slowly and improving  Felt like she might of had a low-grade fever yesterday  She does work at a nursing facility, and also wants to make sure she might not have contracted "C. difficile".  She has not had a recent surgeries hospitalizations or use of antibiotic  Does relate that she is lactose intolerant and symptoms started shortly after having ice cream which she does not typically eat     Physical Exam   Triage Vital Signs: ED Triage Vitals  Enc Vitals Group     BP 03/16/23 1104 129/83     Pulse Rate 03/16/23 1104 86     Resp 03/16/23 1104 18     Temp 03/16/23 1107 98.6 F (37 C)     Temp Source 03/16/23 1107 Oral     SpO2 03/16/23 1104 97 %     Weight 03/16/23 1107 (!) 346 lb (156.9 kg)     Height 03/16/23 1107 5\' 6"  (1.676 m)     Head Circumference --      Peak Flow --      Pain Score 03/16/23 1107 5     Pain Loc --      Pain Edu? --      Excl. in GC? --     Most recent vital signs: Vitals:   03/16/23 1104  03/16/23 1107  BP: 129/83   Pulse: 86   Resp: 18   Temp:  98.6 F (37 C)  SpO2: 97%      General: Awake, no distress.  Very pleasant CV:  Good peripheral perfusion.  Normal tones and rate Resp:  Normal effort.  Abd:  No distention.  Soft nontender throughout.  No right upper quadrant pain.  No pain McBurney's point.  No evidence of peritonitis.  She does report to palpation all quadrants that she just feels sort of a gassy discomfort feeling but no pain.  She appears quite comfortable Other:  She is able to ambulate back and forth to the bathroom without difficulty.   ED Results / Procedures / Treatments   Labs (all labs ordered are listed, but only abnormal results are displayed) Labs Reviewed  COMPREHENSIVE METABOLIC PANEL - Abnormal; Notable for the following components:      Result Value   Potassium 3.4 (*)    Calcium 8.6 (*)    All other components within normal limits  CBC - Abnormal; Notable for the following components:   WBC 11.8 (*)  Hemoglobin 11.1 (*)    MCV 78.3 (*)    MCH 23.6 (*)    RDW 18.3 (*)    All other components within normal limits  URINALYSIS, ROUTINE W REFLEX MICROSCOPIC - Abnormal; Notable for the following components:   Color, Urine YELLOW (*)    APPearance HAZY (*)    All other components within normal limits  GASTROINTESTINAL PANEL BY PCR, STOOL (REPLACES STOOL CULTURE)  C DIFFICILE QUICK SCREEN W PCR REFLEX    LIPASE, BLOOD  POC URINE PREG, ED     EKG     RADIOLOGY  No noted indication for imaging.  No associated focal abdominal pain, no peritonitis, patient well-appearing denies having abdominal pain.  At this juncture, suspect low risk for acute intra-abdominal surgical or major infectious/inflammatory or vascular process.   PROCEDURES:  Critical Care performed: No  Procedures   MEDICATIONS ORDERED IN ED: Medications  ondansetron (ZOFRAN-ODT) disintegrating tablet 4 mg (4 mg Oral Given 03/16/23 1347)  sodium chloride 0.9  % bolus 1,000 mL (0 mLs Intravenous Stopped 03/16/23 1410)     IMPRESSION / MDM / ASSESSMENT AND PLAN / ED COURSE  I reviewed the triage vital signs and the nursing notes.                              Differential diagnosis includes, but is not limited to, lactose intolerance, self-limited causation such as food poisoning, gastritis, colitis, viral gastroenteritis, etc.  She does not have any physical exam or clinical findings that are highly suggestive of acute intra-abdominal process such as abscess, cholecystitis, perforation, appendicitis, diverticulitis, etc.  Very reassuring abdominal exam.  No associated cardiac pulmonary vascular or neurologic symptoms    Patient's presentation is most consistent with acute complicated illness / injury requiring diagnostic workup.   Discussed with patient we will hydrate, provide antiemetic, check labs including stool studies.  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 difficile testing as well as we workup for infectious colitis or gastroenteritis symptoms  Labs interpreted as normal comprehensive metabolic panel.  CBC with very mild anemia and very mild leukocytosis.  Nonspecific findings, in the setting of the patient's clinical history and abdominal examination a doubt this would be representative of acute intra-abdominal surgical process or infection requiring antibiotic  Urinalysis normal.  Pregnancy test negative.  Viral GI panel including C. difficile negative   ----------------------------------------- 2:14 PM on 03/16/2023 ----------------------------------------- Patient ambulatory.  Reports all symptoms resolved.  She is alert oriented no distress.  Denies any ongoing pain discomfort nausea or other symptoms.  Has she is requesting discharge as she needs to assist her son who is just getting done with school.  At this juncture discussed return precautions around her symptoms,  abdominal pain and will prescribe Zofran.  Also discussed she may trial over-the-counter Imodium if diarrhea returns.  She will follow-up with primary care doctor, and if ongoing symptoms such as fever, abdominal pain, or vomiting diarrhea or other concerns arise to return to the ER right away  Return precautions and treatment recommendations and follow-up discussed with the patient who is agreeable with the plan.   FINAL CLINICAL IMPRESSION(S) / ED DIAGNOSES   Final diagnoses:  Nausea vomiting and diarrhea     Rx / DC Orders   ED Discharge Orders          Ordered    ondansetron (ZOFRAN-ODT) 4  MG disintegrating tablet  Every 6 hours PRN        03/16/23 1413             Note:  This document was prepared using Dragon voice recognition software and may include unintentional dictation errors.   Sharyn Creamer, MD 03/16/23 1420

## 2023-04-19 LAB — AMB RESULTS CONSOLE CBG: Glucose: 139

## 2023-04-20 NOTE — Progress Notes (Signed)
Pt received education and resources from congregational nurses. Please follow up on PCP

## 2023-06-06 ENCOUNTER — Encounter: Payer: Self-pay | Admitting: *Deleted

## 2023-06-06 NOTE — Progress Notes (Signed)
Pt attended the 04/19/23 screening event where her b/p was 130/80 and her blood sugar was 139. During the event, the pt did not name a PCP and dis identify food, housing, and utilities insecurities. During initial event f/u call, pt states she is still using [facility] as her PCP of record but has' not been lately" and did not recognize Dr. Margarita Mail name as her PCP, although she was not actively going to [facility] since she saw her official PCP, Dr. Caralee Ates, on 05/03/2022 (per chart review). 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 Per chart review pt last seen by PCP partner at [facility] on 07/23/22. Pt requesting additional SDOH resources for food and housing and utilities besides those given to her at the event, so the [facility] [place] [address] community resource flyer sent to her along with the Get [facility] Now flyer in case additional healthcare options are needed. Additional pt f/u to be scheduled per health equity protocol.

## 2023-07-25 ENCOUNTER — Encounter: Payer: Self-pay | Admitting: *Deleted

## 2023-07-25 NOTE — Progress Notes (Signed)
Pt attended the 04/19/23 screening event where her b/p was 130/80 and her blood sugar was 139. During the event, the pt did not name a PCP and did identify food, housing, and utilities insecurities. During initial event f/u call, pt states she is still using [facility] as her PCP of record but has' not been lately". 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  Pt requesting additional SDOH resources for food and housing and utilities besides those given to her at the event, so the [facility] [place] [address] community resource flyer sent to her along with the [facility] Now flyer in case additional healthcare options are needed. Current chart review indicates pt was last seen by her PCP office on 07/23/22 but missed appts in Feb and April 2024. When speaking with pt during the 60 day post event f/u, she noted she was stressed with NT work and classes and chronic dizziness which she discussed at length, repeating she knew she was anemic and needed to see her dr at the [facility]. Health equity team member offered multiple times to help her schedule a PCP appt but pt preferred to call herself and reiterated her intention to go to the dr and get her labs etc tested "soon." SDOH needs continued per pt so [place] [address] [place] area resources for food and housing and utilities included in a final letter to pt reminding her to call her PCP office for an appt. Event results and current c/o feeling faint were included in in-basket message to her PCP today. No additional health equity team support indicated at this time.

## 2023-07-29 ENCOUNTER — Encounter: Payer: Self-pay | Admitting: Emergency Medicine

## 2023-07-29 ENCOUNTER — Other Ambulatory Visit: Payer: Self-pay

## 2023-07-29 ENCOUNTER — Emergency Department
Admission: EM | Admit: 2023-07-29 | Discharge: 2023-07-29 | Disposition: A | Discharge status: Home / Self Care | Payer: Medicaid Other | Attending: Emergency Medicine | Admitting: Emergency Medicine

## 2023-07-29 ENCOUNTER — Emergency Department: Payer: Medicaid Other

## 2023-07-29 DIAGNOSIS — R062 Wheezing: Secondary | ICD-10-CM | POA: Insufficient documentation

## 2023-07-29 DIAGNOSIS — E876 Hypokalemia: Secondary | ICD-10-CM | POA: Insufficient documentation

## 2023-07-29 DIAGNOSIS — Z1152 Encounter for screening for COVID-19: Secondary | ICD-10-CM | POA: Diagnosis not present

## 2023-07-29 DIAGNOSIS — R112 Nausea with vomiting, unspecified: Secondary | ICD-10-CM | POA: Insufficient documentation

## 2023-07-29 DIAGNOSIS — J069 Acute upper respiratory infection, unspecified: Secondary | ICD-10-CM | POA: Diagnosis not present

## 2023-07-29 DIAGNOSIS — R0602 Shortness of breath: Secondary | ICD-10-CM | POA: Diagnosis present

## 2023-07-29 LAB — COMPREHENSIVE METABOLIC PANEL
ALT: 16 U/L (ref 0–44)
AST: 18 U/L (ref 15–41)
Albumin: 3.5 g/dL (ref 3.5–5.0)
Alkaline Phosphatase: 55 U/L (ref 38–126)
Anion gap: 8 (ref 5–15)
BUN: 7 mg/dL (ref 6–20)
CO2: 25 mmol/L (ref 22–32)
Calcium: 8.4 mg/dL — ABNORMAL LOW (ref 8.9–10.3)
Chloride: 104 mmol/L (ref 98–111)
Creatinine, Ser: 0.64 mg/dL (ref 0.44–1.00)
GFR, Estimated: >60 mL/min (ref >60)
Glucose, Bld: 103 mg/dL — ABNORMAL HIGH (ref 70–99)
Potassium: 2.8 mmol/L — ABNORMAL LOW (ref 3.5–5.1)
Sodium: 137 mmol/L (ref 135–145)
Total Bilirubin: 0.5 mg/dL (ref <1.2)
Total Protein: 7.5 g/dL (ref 6.5–8.1)

## 2023-07-29 LAB — CBC WITH DIFFERENTIAL/PLATELET
Abs Immature Granulocytes: 0.03 10*3/uL (ref 0.00–0.07)
Basophils Absolute: 0 10*3/uL (ref 0.0–0.1)
Basophils Relative: 0 %
Eosinophils Absolute: 0.5 10*3/uL (ref 0.0–0.5)
Eosinophils Relative: 6 %
HCT: 36.1 % (ref 36.0–46.0)
Hemoglobin: 11.5 g/dL — ABNORMAL LOW (ref 12.0–15.0)
Immature Granulocytes: 0 %
Lymphocytes Relative: 20 %
Lymphs Abs: 1.7 10*3/uL (ref 0.7–4.0)
MCH: 24.2 pg — ABNORMAL LOW (ref 26.0–34.0)
MCHC: 31.9 g/dL (ref 30.0–36.0)
MCV: 76 fL — ABNORMAL LOW (ref 80.0–100.0)
Monocytes Absolute: 1.2 10*3/uL — ABNORMAL HIGH (ref 0.1–1.0)
Monocytes Relative: 14 %
Neutro Abs: 5.2 10*3/uL (ref 1.7–7.7)
Neutrophils Relative %: 60 %
Platelets: 347 10*3/uL (ref 150–400)
RBC: 4.75 MIL/uL (ref 3.87–5.11)
RDW: 17.5 % — ABNORMAL HIGH (ref 11.5–15.5)
WBC: 8.6 10*3/uL (ref 4.0–10.5)
nRBC: 0 % (ref 0.0–0.2)

## 2023-07-29 LAB — MAGNESIUM: Magnesium: 2 mg/dL (ref 1.7–2.4)

## 2023-07-29 LAB — TROPONIN I (HIGH SENSITIVITY): Troponin I (High Sensitivity): 7 ng/L (ref <18)

## 2023-07-29 LAB — RESP PANEL BY RT-PCR (RSV, FLU A&B, COVID)  RVPGX2
Influenza A by PCR: NEGATIVE
Influenza B by PCR: NEGATIVE
Resp Syncytial Virus by PCR: NEGATIVE
SARS Coronavirus 2 by RT PCR: NEGATIVE

## 2023-07-29 LAB — LIPASE, BLOOD: Lipase: 24 U/L (ref 11–51)

## 2023-07-29 MED ORDER — AEROCHAMBER MV MISC: 0 refills | Status: DC

## 2023-07-29 MED ORDER — ALBUTEROL SULFATE HFA 108 (90 BASE) MCG/ACT IN AERS: 2.0000 | INHALATION_SPRAY | Freq: Four times a day (QID) | RESPIRATORY_TRACT | 2 refills | Status: DC | PRN

## 2023-07-29 MED ORDER — SODIUM CHLORIDE 0.9 % IV BOLUS
1000.0000 mL | Freq: Once | INTRAVENOUS | Status: AC
  Administered 2023-07-29: 1000 mL via INTRAVENOUS

## 2023-07-29 MED ORDER — IPRATROPIUM-ALBUTEROL 0.5-2.5 (3) MG/3ML IN SOLN
3.0000 mL | Freq: Once | RESPIRATORY_TRACT | Status: AC
  Administered 2023-07-29: 3 mL via RESPIRATORY_TRACT
  Filled 2023-07-29: qty 3

## 2023-07-29 MED ORDER — PREDNISONE 50 MG PO TABS
50.0000 mg | ORAL_TABLET | Freq: Every day | ORAL | 0 refills | Status: AC
Start: 2023-07-29 — End: 2023-08-02

## 2023-07-29 MED ORDER — PREDNISONE 20 MG PO TABS
60.0000 mg | ORAL_TABLET | Freq: Once | ORAL | Status: AC
  Administered 2023-07-29: 60 mg via ORAL
  Filled 2023-07-29: qty 3

## 2023-07-29 MED ORDER — ONDANSETRON HCL 4 MG/2ML IJ SOLN
4.0000 mg | Freq: Once | INTRAMUSCULAR | Status: AC
  Administered 2023-07-29: 4 mg via INTRAVENOUS
  Filled 2023-07-29: qty 2

## 2023-07-29 MED ORDER — ONDANSETRON HCL 4 MG PO TABS: 4.0000 mg | ORAL_TABLET | Freq: Three times a day (TID) | ORAL | 0 refills | Status: DC | PRN

## 2023-07-29 MED ORDER — POTASSIUM CHLORIDE CRYS ER 20 MEQ PO TBCR
80.0000 meq | EXTENDED_RELEASE_TABLET | Freq: Once | ORAL | Status: AC
  Administered 2023-07-29: 80 meq via ORAL
  Filled 2023-07-29: qty 4

## 2023-07-29 MED ORDER — SODIUM CHLORIDE 0.9 % IV BOLUS: 1000.0000 mL | Freq: Once | INTRAVENOUS | Status: DC

## 2023-07-29 NOTE — ED Provider Notes (Signed)
[facility] Provider Note    Event Date/Time   First MD Initiated Contact with Patient 07/29/23 [phone]     (approximate)   History   Shortness of Breath   HPI  Yesenia Thomas is a 42 y.o. female   Past medical history of iron deficiency anemia, GERD, anxiety and depression, BMI of 58 who presents to the emergency department with shortness of breath/chest tightness and productive cough with brown sputum, as well as nausea vomiting diarrhea for the last several days.  Her son was sick with flulike symptoms earlier this week.  She denies chest pain.  She has taken Tylenol flu and cold with some relief.  She denies abdominal pain.  She denies GI bleeding.  She denies urinary symptoms.  She has subjective fevers/chills, sweating.  External Medical Documents Reviewed: Internal medicine outpatient visit from August 2023 documenting past medical history including iron deficiency anemia, GERD, and a recent diagnosis at that time of pneumonia treated with antibiotics steroids      Physical Exam   Triage Vital Signs: ED Triage Vitals  Encounter Vitals Group     BP 07/29/23 0451 (!) 146/92     Systolic BP Percentile --      Diastolic BP Percentile --      Pulse Rate 07/29/23 0451 89     Resp 07/29/23 0451 18     Temp 07/29/23 0451 98.2 F (36.8 C)     Temp src --      SpO2 07/29/23 0451 94 %     Weight 07/29/23 0446 (!) 350 lb (158.8 kg)     Height 07/29/23 0446 5\' 5"  (1.651 m)     Head Circumference --      Peak Flow --      Pain Score 07/29/23 0446 6     Pain Loc --      Pain Education --      Exclude from Growth Chart --     Most recent vital signs: Vitals:   07/29/23 0451  BP: (!) 146/92  Pulse: 89  Resp: 18  Temp: 98.2 F (36.8 C)  SpO2: 94%    General: Awake, no distress.  CV:  Good peripheral perfusion.  Resp:  Normal effort.  Abd:  No distention.  Other:  Slightly hypertensive otherwise vital signs normal, pleasant  patient in no acute distress.  Occasional productive sounding cough and lung exam without focality though with scant wheezing throughout all lung fields.  No respiratory distress.  Appears euvolemic, no hypoxemia.  No fever.  Abdomen is soft nondistended nontender deep palpation all quadrants.   ED Results / Procedures / Treatments   Labs (all labs ordered are listed, but only abnormal results are displayed) Labs Reviewed  CBC WITH DIFFERENTIAL/PLATELET - Abnormal; Notable for the following components:      Result Value   Hemoglobin 11.5 (*)    MCV 76.0 (*)    MCH 24.2 (*)    RDW 17.5 (*)    Monocytes Absolute 1.2 (*)    All other components within normal limits  COMPREHENSIVE METABOLIC PANEL - Abnormal; Notable for the following components:   Potassium 2.8 (*)    Glucose, Bld 103 (*)    Calcium 8.4 (*)    All other components within normal limits  RESP PANEL BY RT-PCR (RSV, FLU A&B, COVID)  [id number]  LIPASE, BLOOD  MAGNESIUM  TROPONIN I (HIGH SENSITIVITY)     I ordered and reviewed the above labs they  are notable for normal white blood cell count, low potassium at 2.8.  EKG  ED ECG REPORT I, Pilar Jarvis, the attending physician, personally viewed and interpreted this ECG.   Date: 07/29/2023  EKG Time: 0447  Rate: 0447  Rhythm: sinus  Axis: nl  Intervals: nl  ST&T Change: no acute ischemic changes     RADIOLOGY I independently reviewed and interpreted chest x-ray and I see no obvious focalities or pneumothorax I also reviewed radiologist's formal read.   PROCEDURES:  Critical Care performed: No  Procedures   MEDICATIONS ORDERED IN ED: Medications  potassium chloride SA (KLOR-CON M) CR tablet 80 mEq (has no administration in time range)  ondansetron (ZOFRAN) injection 4 mg (4 mg Intravenous Given 07/29/23 0544)  ipratropium-albuterol (DUONEB) 0.5-2.5 (3) MG/3ML nebulizer solution 3 mL (3 mLs Nebulization Given 07/29/23 0546)  predniSONE (DELTASONE) tablet 60  mg (60 mg Oral Given 07/29/23 0546)  sodium chloride 0.9 % bolus 1,000 mL (1,000 mLs Intravenous New Bag/Given 07/29/23 0545)     IMPRESSION / MDM / ASSESSMENT AND PLAN / ED COURSE  I reviewed the triage vital signs and the nursing notes.                                Patient's presentation is most consistent with acute presentation with potential threat to life or bodily function.  Differential diagnosis includes, but is not limited to, viral syndrome, viral gastroenteritis, viral URI, bacterial pneumonia, considered but less likely sepsis or intra-abdominal surgical infections   The patient is on the cardiac monitor to evaluate for evidence of arrhythmia and/or significant heart rate changes.  MDM:    Is a patient with viral illness most likely causing GI symptoms nausea vomiting diarrhea as well as upper respiratory infectious symptoms nasal congestion, sore throat and cough.  She appears euvolemic, will check electrolytes, basic labs for renal function or electrolyte abnormalities in the setting of GI losses.  Give IV fluid bolus as well as IV antiemetic.  Check chest x-ray for any evidence of bacterial pneumonia but fortunately no focal opacities suggest this.  Defer antibiotics.  She appears nontoxic, normal vital signs, afebrile I doubt sepsis.  She has a soft nontender abdominal exam so I doubt surgical abdominal pathologies at this time.  She has wheezing throughout, non-smoker, no history of asthma, but has been treated for asthma exacerbation with URI in the past with good results from her albuterol/prednisone, and with wheezing today we will treat with the same.  Hypokalemia with no EKG changes, repletion ordered in the emergency department.  --  Otherwise looks well, nontoxic doubt sepsis plan for outpatient therapy and PMD follow-up.       FINAL CLINICAL IMPRESSION(S) / ED DIAGNOSES   Final diagnoses:  Viral URI  Nausea vomiting and diarrhea  Hypokalemia   Wheezing     Rx / DC Orders   ED Discharge Orders          Ordered    predniSONE (DELTASONE) 50 MG tablet  Daily        07/29/23 0528    albuterol (VENTOLIN HFA) 108 (90 Base) MCG/ACT inhaler  Every 6 hours PRN        07/29/23 0528    Spacer/Aero-Holding Chambers (AEROCHAMBER MV) inhaler        07/29/23 0528    ondansetron (ZOFRAN) 4 MG tablet  Every 8 hours PRN  07/29/23 0528             Note:  This document was prepared using Dragon voice recognition software and may include unintentional dictation errors.    Pilar Jarvis, MD 07/29/23 [phone]

## 2023-07-29 NOTE — ED Triage Notes (Signed)
Patient ambulatory to triage, diaphoretic; reports last few days having N/V/D, SHOB and chest tightness; has had prod cough yellow/brown sputum; took home COVID test that was neg

## 2023-07-29 NOTE — Discharge Instructions (Addendum)
Take inhaler 2 puffs every 4-6 hours as needed for shortness of breath or wheezing.  Take prednisone as prescribed for the full 4-day course.  Your x-ray of the chest showed no bacterial pneumonia so there is no need for an antibiotic at this time.  Check the results of your MyChart for COVID/flu/RSV testing-if any of these are positive it should not change the treatment recommendations we spoke about already.  Take Zofran as needed for nausea/vomiting.  Your potassium was slightly low.  You got some extra potassium in the emergency department.  Please see the attached documents about low potassium and ways to increase potassium intake in your diet and have this level rechecked by your doctor in the next 1 to 2 weeks.  Drink plenty of fluids to stay well-hydrated.  Find Pedialyte or similar electrolyte rehydration formulas at your local pharmacy.  For aches/pains/fever you can take Tylenol 650 mg and ibuprofen 400 mg every 6 hours as needed.  Take with food.  Thank you for choosing [place] for your health care today!  Please see your primary doctor this week for a follow up appointment.   If you have any new, worsening, or unexpected symptoms call your doctor right away or come back to the emergency department for reevaluation.  It was my pleasure to care for you today.   Daneil Dan Modesto Charon, MD

## 2023-09-20 ENCOUNTER — Encounter: Payer: Self-pay | Admitting: Internal Medicine

## 2023-09-20 ENCOUNTER — Other Ambulatory Visit: Payer: Self-pay

## 2023-09-20 ENCOUNTER — Ambulatory Visit
Admission: RE | Admit: 2023-09-20 | Discharge: 2023-09-20 | Disposition: A | Discharge status: Home / Self Care | Payer: Medicaid Other | Source: Ambulatory Visit | Attending: Internal Medicine | Admitting: Internal Medicine

## 2023-09-20 ENCOUNTER — Ambulatory Visit
Admission: RE | Admit: 2023-09-20 | Discharge: 2023-09-20 | Disposition: A | Discharge status: Home / Self Care | Payer: Medicaid Other | Attending: Internal Medicine | Admitting: Internal Medicine

## 2023-09-20 ENCOUNTER — Ambulatory Visit: Payer: Medicaid Other | Admitting: Internal Medicine

## 2023-09-20 VITALS — BP 128/82 | HR 91 | Temp 97.9°F | Resp 16 | Ht 66.0 in | Wt 335.1 lb

## 2023-09-20 DIAGNOSIS — G8929 Other chronic pain: Secondary | ICD-10-CM

## 2023-09-20 DIAGNOSIS — M25559 Pain in unspecified hip: Secondary | ICD-10-CM

## 2023-09-20 DIAGNOSIS — M79674 Pain in right toe(s): Secondary | ICD-10-CM | POA: Insufficient documentation

## 2023-09-20 DIAGNOSIS — B351 Tinea unguium: Secondary | ICD-10-CM

## 2023-09-20 NOTE — Progress Notes (Signed)
 Acute Office Visit  Subjective:     Patient ID: Yesenia Thomas, female    DOB: 04-10-1981, 43 y.o.   MRN: [id number]  Chief Complaint  Patient presents with   Consult    Need letter stating patient cannot do steps at her new apartment due to medical reasons    HPI Patient is in today for ongoing hip pain and acute foot pain. She is needing a letter regarding her living situation as well. She has not been seen for regular medical follow up since 05/03/22.  She has chronic bilateral hip pain for sometime, last x-rays were in 2022 and were negative at that time. She takes Tylenol and Ibuprofen  for the pain which does help. She is interested in a referral to Orthopedics to discuss further. Due to her chronic hip pain she is needing a note for her apartment complex. She had been in a one story apartment but due to renovations she's now being moved to a 2 story apartment with stairs that she cannot climb comfortably due to her joint pain.   She also was moving furniture 1 week ago and stubbed her great right toe, now has pain and bruising around the nail.    Review of Systems  Musculoskeletal:  Positive for joint pain.  Skin: Negative.         Objective:    BP 128/82 (Cuff Size: Large)   Pulse 91   Temp 97.9 F (36.6 C) (Oral)   Resp 16   Ht 5' 6 (1.676 m)   Wt (!) 335 lb 1.6 oz (152 kg)   SpO2 98%   BMI 54.09 kg/m  BP Readings from Last 3 Encounters:  09/20/23 128/82  07/29/23 (!) 141/90  04/19/23 130/80   Wt Readings from Last 3 Encounters:  09/20/23 (!) 335 lb 1.6 oz (152 kg)  07/29/23 (!) 350 lb (158.8 kg)  03/16/23 (!) 346 lb (156.9 kg)      Physical Exam Constitutional:      Appearance: Normal appearance. She is obese.  HENT:     Head: Normocephalic and atraumatic.  Eyes:     Conjunctiva/sclera: Conjunctivae normal.  Cardiovascular:     Rate and Rhythm: Normal rate and regular rhythm.  Pulmonary:     Effort: Pulmonary effort is normal.     Breath  sounds: Normal breath sounds.  Musculoskeletal:     Right foot: Normal range of motion. No deformity, bunion, Charcot foot, foot drop or prominent metatarsal heads.  Feet:     Right foot:     Skin integrity: Dry skin present.     Toenail Condition: Fungal disease present.    Comments: Right great toenail with hematoma, fungal disease. Pain to palpation of the distal phalange.  Skin:    General: Skin is warm and dry.  Neurological:     General: No focal deficit present.     Mental Status: She is alert. Mental status is at baseline.  Psychiatric:        Mood and Affect: Mood normal.        Behavior: Behavior normal.     No results found for any visits on 09/20/23.      Assessment & Plan:   1. Pain of right great toe (Primary): Will order an x-ray to assess due to pain but she is weight-bearing.   - DG Foot Complete Right; Future  2. Onychomycosis: Referral to Podiatry for nail trimming and fungal disease.   - Ambulatory referral to Podiatry  3. Chronic hip pain, unspecified laterality: Letter written for her to live in a one story apartment, patient wanting referral to Ortho, referral placed.   - AMB referral to orthopedics   Return for physical.  Sharyle Fischer, DO

## 2023-10-10 ENCOUNTER — Ambulatory Visit: Payer: Medicaid Other | Admitting: Internal Medicine

## 2023-10-10 ENCOUNTER — Encounter: Payer: Self-pay | Admitting: Internal Medicine

## 2023-10-10 ENCOUNTER — Other Ambulatory Visit: Payer: Self-pay

## 2023-10-10 VITALS — BP 130/80 | HR 73 | Temp 97.6°F | Resp 16 | Ht 66.0 in | Wt 331.3 lb

## 2023-10-10 DIAGNOSIS — Z1231 Encounter for screening mammogram for malignant neoplasm of breast: Secondary | ICD-10-CM

## 2023-10-10 DIAGNOSIS — R5383 Other fatigue: Secondary | ICD-10-CM | POA: Diagnosis not present

## 2023-10-10 DIAGNOSIS — E559 Vitamin D deficiency, unspecified: Secondary | ICD-10-CM | POA: Diagnosis not present

## 2023-10-10 DIAGNOSIS — Z1159 Encounter for screening for other viral diseases: Secondary | ICD-10-CM

## 2023-10-10 DIAGNOSIS — Z13220 Encounter for screening for lipoid disorders: Secondary | ICD-10-CM

## 2023-10-10 DIAGNOSIS — D509 Iron deficiency anemia, unspecified: Secondary | ICD-10-CM

## 2023-10-10 DIAGNOSIS — M5416 Radiculopathy, lumbar region: Secondary | ICD-10-CM

## 2023-10-10 DIAGNOSIS — Z0001 Encounter for general adult medical examination with abnormal findings: Secondary | ICD-10-CM

## 2023-10-10 DIAGNOSIS — Z0000 Encounter for general adult medical examination without abnormal findings: Secondary | ICD-10-CM

## 2023-10-10 MED ORDER — TIZANIDINE HCL 4 MG PO TABS: 4.0000 mg | ORAL_TABLET | Freq: Every evening | ORAL | 0 refills | Status: DC | PRN

## 2023-10-10 NOTE — Progress Notes (Signed)
Name: Yesenia Thomas   MRN: [id number]    DOB: 05-19-1981   Date:10/10/2023       Progress Note  Subjective  Chief Complaint  Chief Complaint  Patient presents with   Annual Exam    HPI  Patient presents for annual CPE. Patient continues to complain of ongoing hip and back pain, has Orthopedics appointment the end of the month. Is also complaining of fatigue, does have a history of iron deficiency anemia.   Diet: Regular Exercise: None Last Eye Exam: 2023 Last Dental Exam: 2023  Flowsheet Row Office Visit from 10/10/2023 in [facility]  AUDIT-C Score 1      Depression: Phq 9 is  negative    10/10/2023    9:38 AM 09/20/2023    1:55 PM 07/23/2022   10:30 AM 05/03/2022    9:34 AM 02/22/2022    1:59 PM  Depression screen PHQ 2/9  Decreased Interest 0 0 0 0 0  Down, Depressed, Hopeless 0 0 0 0 0  PHQ - 2 Score 0 0 0 0 0  Altered sleeping   0 0 0  Tired, decreased energy   0 0 0  Change in appetite   0 0 0  Feeling bad or failure about yourself    0 0 0  Trouble concentrating   0 0 0  Moving slowly or fidgety/restless   0 0 0  Suicidal thoughts   0 0 0  PHQ-9 Score   0 0 0  Difficult doing work/chores   Not difficult at all Not difficult at all Not difficult at all   Hypertension: BP Readings from Last 3 Encounters:  10/10/23 130/80  09/20/23 128/82  07/29/23 (!) 141/90   Obesity: Wt Readings from Last 3 Encounters:  10/10/23 (!) 331 lb 4.8 oz (150.3 kg)  09/20/23 (!) 335 lb 1.6 oz (152 kg)  07/29/23 (!) 350 lb (158.8 kg)   BMI Readings from Last 3 Encounters:  10/10/23 53.47 kg/m  09/20/23 54.09 kg/m  07/29/23 58.24 kg/m     Vaccines: declines flu vaccine, up-to-date otherwise  Hep C Screening: getting today STD testing and prevention (HIV/chl/gon/syphilis): no concerns  Intimate partner violence: negative screen  LMP: irregular, was told by Gynecology that she was in pre-menopause  Discussed importance of follow up if any  post-menopausal bleeding: no  Incontinence Symptoms: negative for symptoms   Breast cancer:  - Last Mammogram: 07/22/2022; due and ordered today  Osteoporosis Prevention : Discussed high calcium and vitamin D supplementation, weight bearing exercises Bone density :not applicable   Cervical cancer screening: up-to-date, 4/23  Skin cancer: Discussed monitoring for atypical lesions  Colorectal cancer: Discussed starting screening at age 34   Lung cancer:  Low Dose CT Chest recommended if Age 35-80 years, 20 pack-year currently smoking OR have quit w/in 15years. Patient does not qualify for screen   ECG: 08/01/23  Advanced Care Planning: A voluntary discussion about advance care planning including the explanation and discussion of advance directives.  Discussed health care proxy and Living will, and the patient was able to identify a health care proxy as Lezlie Lye (mother).  Patient does not have a living will and power of attorney of health care   Patient Active Problem List   Diagnosis Date Noted   Dizzy spells 08/26/2021   Lumbar back pain with radiculopathy affecting right lower extremity 08/26/2021   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   Dysthymia 08/26/2021   Severe obesity (BMI >= 40) (  HCC) 07/20/2017   Iron deficiency anemia 08/02/2013   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   GERD (gastroesophageal reflux disease) 04/17/2013    Past Surgical History:  Procedure Laterality Date   CESAREAN SECTION      Family History  Problem Relation Age of Onset   Hypertension Mother    Thrombosis Maternal Grandmother    Clotting disorder Maternal Grandfather    Deep vein thrombosis Maternal Grandfather    Heart attack Paternal Grandmother     Social History   Socioeconomic History   Marital status: Unknown    Spouse name: Not on file   Number of children: Not on file   Years of education: Not on file   Highest education level: Not on file  Occupational History   Not on file   Tobacco Use   Smoking status: Never   Smokeless tobacco: Never  Vaping Use   Vaping status: Never Used  Substance and Sexual Activity   Alcohol use: Not Currently   Drug use: Not Currently    Types: Marijuana   Sexual activity: Yes  Other Topics Concern   Not on file  Social History Narrative   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   Social Drivers of Health   Financial Resource Strain: Low Risk  (10/10/2023)   Overall Financial Resource Strain (CARDIA)    Difficulty of Paying Living Expenses: Not hard at all  Food Insecurity: No Food Insecurity (10/10/2023)   Hunger Vital Sign    Worried About Running Out of Food in the Last Year: Never true    Ran Out of Food in the Last Year: Never true  Transportation Needs: No Transportation Needs (04/20/2023)   PRAPARE - Administrator, Civil Service (Medical): No    Lack of Transportation (Non-Medical): No  Physical Activity: Inactive (10/10/2023)   Exercise Vital Sign    Days of Exercise per Week: 0 days    Minutes of Exercise per Session: 0 min  Stress: No Stress Concern Present (10/10/2023)   [employer] of Occupational Health - Occupational Stress Questionnaire    Feeling of Stress : Only a little  Social Connections: Moderately Isolated (10/10/2023)   Social Connection and Isolation Panel [NHANES]    Frequency of Communication with Friends and Family: More than three times a week    Frequency of Social Gatherings with Friends and Family: More than three times a week    Attends Religious Services: More than 4 times per year    Active Member of [employer] or Organizations: No    Attends Banker Meetings: Never    Marital Status: Never married  Intimate Partner Violence: Not At Risk (10/10/2023)   Humiliation, Afraid, Rape, and Kick questionnaire    Fear of Current or Ex-Partner: No    Emotionally Abused: No    Physically Abused: No    Sexually Abused: No     Current Outpatient Medications:    Acetaminophen 500 MG  coapsule, Take by mouth as needed., Disp: , Rfl:    albuterol (VENTOLIN HFA) 108 (90 Base) MCG/ACT inhaler, Inhale 2 puffs into the lungs every 6 (six) hours as needed for wheezing or shortness of breath., Disp: 8 g, Rfl: 2   cyanocobalamin (VITAMIN B12) 1000 MCG tablet, Take 3,000 mcg by mouth daily., Disp: , Rfl:    famotidine (PEPCID) 20 MG tablet, Take 1 tablet (20 mg total) by mouth daily., Disp: 90 tablet, Rfl: 1   fluticasone (FLONASE) 50 MCG/ACT nasal spray, Place 2 sprays into both  nostrils daily., Disp: 16 g, Rfl: 6   Turmeric (QC TUMERIC COMPLEX) 500 MG CAPS, Take by mouth., Disp: , Rfl:    Accu-Chek Softclix Lancets lancets, Use as instructed (Patient not taking: Reported on 06/14/2022), Disp: 100 each, Rfl: 12   Ashwagandha 120 MG CAPS, Take 1 capsule by mouth daily. (Patient not taking: Reported on 10/10/2023), Disp: , Rfl:    blood glucose meter kit and supplies KIT, Dispense based on patient and insurance preference. Use up to four times daily as directed. (Patient not taking: Reported on 06/14/2022), Disp: 1 each, Rfl: 0   cetirizine (ZYRTEC) 10 MG tablet, Take 1 tablet (10 mg total) by mouth daily. (Patient not taking: Reported on 10/10/2023), Disp: 30 tablet, Rfl: 11   ferrous gluconate (FERGON) 324 MG tablet, Take 1 tablet (324 mg total) by mouth daily. (Patient not taking: Reported on 06/14/2022), Disp: 90 tablet, Rfl: 1   glucose blood test strip, Use as instructed (Patient not taking: Reported on 06/14/2022), Disp: 100 each, Rfl: 12   ondansetron (ZOFRAN) 4 MG tablet, Take 1 tablet (4 mg total) by mouth every 8 (eight) hours as needed for nausea or vomiting. (Patient not taking: Reported on 10/10/2023), Disp: 20 tablet, Rfl: 0   Spacer/Aero-Holding Chambers (AEROCHAMBER MV) inhaler, Use as instructed (Patient not taking: Reported on 10/10/2023), Disp: 1 each, Rfl: 0  Allergies  Allergen Reactions   Lactose Intolerance (Gi)    Percocet [Oxycodone-Acetaminophen]      Review of  Systems  Constitutional:  Positive for malaise/fatigue. Negative for chills and fever.  Musculoskeletal:  Positive for back pain and joint pain.    Objective  Vitals:   10/10/23 0931  BP: 130/80  Pulse: 73  Resp: 16  SpO2: 99%  Weight: (!) 331 lb 4.8 oz (150.3 kg)  Height: 5\' 6"  (1.676 m)    Body mass index is 53.47 kg/m.  Physical Exam Constitutional:      Appearance: Normal appearance. She is obese.  HENT:     Head: Normocephalic and atraumatic.     Mouth/Throat:     Mouth: Mucous membranes are moist.     Pharynx: Oropharynx is clear.  Eyes:     Extraocular Movements: Extraocular movements intact.     Conjunctiva/sclera: Conjunctivae normal.     Pupils: Pupils are equal, round, and reactive to light.  Neck:     Comments: No thyromegaly Cardiovascular:     Rate and Rhythm: Normal rate and regular rhythm.  Pulmonary:     Effort: Pulmonary effort is normal.     Breath sounds: Normal breath sounds.  Musculoskeletal:     Cervical back: No tenderness.  Lymphadenopathy:     Cervical: No cervical adenopathy.  Skin:    General: Skin is warm and dry.  Neurological:     General: No focal deficit present.     Mental Status: She is alert. Mental status is at baseline.  Psychiatric:        Mood and Affect: Mood normal.        Behavior: Behavior normal.     Last CBC Lab Results  Component Value Date   WBC 8.6 07/29/2023   HGB 11.5 (L) 07/29/2023   HCT 36.1 07/29/2023   MCV 76.0 (L) 07/29/2023   MCH 24.2 (L) 07/29/2023   RDW 17.5 (H) 07/29/2023   PLT 347 07/29/2023   Last metabolic panel Lab Results  Component Value Date   GLUCOSE 103 (H) 07/29/2023   NA 137 07/29/2023   K 2.8 (  L) 07/29/2023   CL 104 07/29/2023   CO2 25 07/29/2023   BUN 7 07/29/2023   CREATININE 0.64 07/29/2023   GFRNONAA >60 07/29/2023   CALCIUM 8.4 (L) 07/29/2023   PROT 7.5 07/29/2023   ALBUMIN 3.5 07/29/2023   BILITOT 0.5 07/29/2023   ALKPHOS 55 07/29/2023   AST 18 07/29/2023    ALT 16 07/29/2023   ANIONGAP 8 07/29/2023   Last lipids Lab Results  Component Value Date   CHOL 168 12/30/2021   HDL 39 (L) 12/30/2021   LDLCALC 108 (H) 12/30/2021   TRIG 114 12/30/2021   CHOLHDL 4.3 12/30/2021   Last hemoglobin A1c Lab Results  Component Value Date   HGBA1C 5.6 05/03/2022   Last thyroid functions Lab Results  Component Value Date   TSH 0.52 05/03/2022   Last vitamin D No results found for: "25OHVITD2", "25OHVITD3", "VD25OH" Last vitamin B12 and Folate Lab Results  Component Value Date   VITAMINB12 355 02/24/2021   FOLATE 13.9 02/24/2021      Assessment & Plan  1. Annual physical exam (Primary): Physical exam completed, health maintenance reviewed and annual labs ordered.   - MM 3D SCREENING MAMMOGRAM BILATERAL BREAST; Future - CBC w/Diff/Platelet - COMPLETE METABOLIC PANEL WITH GFR - Lipid Profile - Fe+TIBC+Fer - Hepatitis C Antibody  2. Iron deficiency anemia, unspecified iron deficiency anemia type: Uncertain if this is the reason for her fatigue, will recheck CBC and iron panel.   - CBC w/Diff/Platelet - Fe+TIBC+Fer  3. Other fatigue: Add on TSH due to ongoing fatigue.   - TSH  4. Lipid screening/Need for hepatitis C screening test: Screening tests ordered.   - Lipid Profile - Hepatitis C Antibody  5. Encounter for screening mammogram for malignant neoplasm of breast: Mammogram ordered.   - MM 3D SCREENING MAMMOGRAM BILATERAL BREAST; Future  6. Vitamin D deficiency: Check vitamin D with labs today.  - Vitamin D (25 hydroxy)  7. Lumbar back pain with radiculopathy affecting right lower extremity: Wanting pain medication today - discussed she can take Tylenol and Ibuprofen but I do not prescribe anything stronger than that. Will prescribe muscle relaxers to take at night as needed. She has an appointment with Orthopedics 10/14/23.   - tiZANidine (ZANAFLEX) 4 MG tablet; Take 1 tablet (4 mg total) by mouth at bedtime as needed for  muscle spasms.  Dispense: 30 tablet; Refill: 0   -USPSTF grade A and B recommendations reviewed with patient; age-appropriate recommendations, preventive care, screening tests, etc discussed and encouraged; healthy living encouraged; see AVS for patient education given to patient -Discussed importance of 150 minutes of physical activity weekly, eat two servings of fish weekly, eat one serving of tree nuts ( cashews, pistachios, pecans, almonds.Marland Kitchen) every other day, eat 6 servings of fruit/vegetables daily and drink plenty of water and avoid sweet beverages.   -Reviewed Health Maintenance: Yes.

## 2023-10-11 LAB — CBC WITH DIFFERENTIAL/PLATELET
Absolute Lymphocytes: 2920 {cells}/uL (ref 850–3900)
Absolute Monocytes: 735 {cells}/uL (ref 200–950)
Basophils Absolute: 28 {cells}/uL (ref 0–200)
Basophils Relative: 0.3 %
Eosinophils Absolute: 214 {cells}/uL (ref 15–500)
Eosinophils Relative: 2.3 %
HCT: 37.8 % (ref 35.0–45.0)
Hemoglobin: 11.6 g/dL — ABNORMAL LOW (ref 11.7–15.5)
MCH: 23.6 pg — ABNORMAL LOW (ref 27.0–33.0)
MCHC: 30.7 g/dL — ABNORMAL LOW (ref 32.0–36.0)
MCV: 77 fL — ABNORMAL LOW (ref 80.0–100.0)
MPV: 9.5 fL (ref 7.5–12.5)
Monocytes Relative: 7.9 %
Neutro Abs: 5403 {cells}/uL (ref 1500–7800)
Neutrophils Relative %: 58.1 %
Platelets: 358 10*3/uL (ref 140–400)
RBC: 4.91 10*6/uL (ref 3.80–5.10)
RDW: 15.6 % — ABNORMAL HIGH (ref 11.0–15.0)
Total Lymphocyte: 31.4 %
WBC: 9.3 10*3/uL (ref 3.8–10.8)

## 2023-10-11 LAB — LIPID PANEL
Cholesterol: 165 mg/dL (ref <200)
HDL: 39 mg/dL — ABNORMAL LOW (ref >=50)
LDL Cholesterol (Calc): 108 mg/dL — ABNORMAL HIGH
Non-HDL Cholesterol (Calc): 126 mg/dL (ref <130)
Total CHOL/HDL Ratio: 4.2 (calc) (ref <5.0)
Triglycerides: 87 mg/dL (ref <150)

## 2023-10-11 LAB — COMPLETE METABOLIC PANEL WITH GFR
AG Ratio: 1.3 (calc) (ref 1.0–2.5)
ALT: 14 U/L (ref 6–29)
AST: 15 U/L (ref 10–30)
Albumin: 3.9 g/dL (ref 3.6–5.1)
Alkaline phosphatase (APISO): 54 U/L (ref 31–125)
BUN: 8 mg/dL (ref 7–25)
CO2: 28 mmol/L (ref 20–32)
Calcium: 9 mg/dL (ref 8.6–10.2)
Chloride: 105 mmol/L (ref 98–110)
Creat: 0.64 mg/dL (ref 0.50–0.99)
Globulin: 3 g/dL (ref 1.9–3.7)
Glucose, Bld: 81 mg/dL (ref 65–99)
Potassium: 4.1 mmol/L (ref 3.5–5.3)
Sodium: 140 mmol/L (ref 135–146)
Total Bilirubin: 0.3 mg/dL (ref 0.2–1.2)
Total Protein: 6.9 g/dL (ref 6.1–8.1)
eGFR: 113 mL/min/{1.73_m2} (ref >=60)

## 2023-10-11 LAB — IRON,TIBC AND FERRITIN PANEL
%SAT: 8 % — ABNORMAL LOW (ref 16–45)
Ferritin: 12 ng/mL — ABNORMAL LOW (ref 16–232)
Iron: 26 ug/dL — ABNORMAL LOW (ref 40–190)
TIBC: 341 ug/dL (ref 250–450)

## 2023-10-11 LAB — VITAMIN D 25 HYDROXY (VIT D DEFICIENCY, FRACTURES): Vit D, 25-Hydroxy: 7 ng/mL — ABNORMAL LOW (ref 30–100)

## 2023-10-11 LAB — HEPATITIS C ANTIBODY: Hepatitis C Ab: NONREACTIVE

## 2023-10-11 LAB — TSH: TSH: 1.19 m[IU]/L

## 2023-10-11 MED ORDER — FERROUS GLUCONATE 324 (38 FE) MG PO TABS: 324.0000 mg | ORAL_TABLET | Freq: Every day | ORAL | 3 refills | Status: DC

## 2023-10-11 MED ORDER — VITAMIN D (ERGOCALCIFEROL) 1.25 MG (50000 UNIT) PO CAPS: 50000.0000 [IU] | ORAL_CAPSULE | ORAL | 0 refills | Status: DC

## 2023-10-11 NOTE — Addendum Note (Signed)
Addended by: Margarita Mail on: 10/11/2023 08:07 AM   Modules accepted: Orders

## 2023-10-14 ENCOUNTER — Other Ambulatory Visit: Payer: Medicaid Other

## 2023-10-17 ENCOUNTER — Other Ambulatory Visit: Payer: Medicaid Other

## 2023-10-26 ENCOUNTER — Encounter: Payer: Self-pay | Admitting: Podiatry

## 2023-10-26 ENCOUNTER — Ambulatory Visit: Payer: Medicaid Other | Admitting: Podiatry

## 2023-10-26 DIAGNOSIS — L601 Onycholysis: Secondary | ICD-10-CM

## 2023-10-26 DIAGNOSIS — S90211A Contusion of right great toe with damage to nail, initial encounter: Secondary | ICD-10-CM | POA: Diagnosis not present

## 2023-10-26 DIAGNOSIS — S92424A Nondisplaced fracture of distal phalanx of right great toe, initial encounter for closed fracture: Secondary | ICD-10-CM | POA: Diagnosis not present

## 2023-10-26 NOTE — Patient Instructions (Signed)

## 2023-10-26 NOTE — Progress Notes (Addendum)
  Subjective:  Patient ID: Yesenia Thomas, female    DOB: 1981-08-20,  MRN: [id number]  Chief Complaint  Patient presents with   Nail Problem    "I stubbed my big toe on Christmas Eve.  I feel like my toenail is not attached.  It hurts when I touch it.  It's starting to burn."    43 y.o. female presents with the above complaint. History confirmed with patient.  She suffered a fracture and was sent for x-rays.  She has been in a walking boot for a few weeks to treat the fracture.  This is doing better but still has damage to the nail and the nail is getting loose  Objective:  Physical Exam: warm, good capillary refill, no trophic changes or ulcerative lesions, normal DP and PT pulses, normal sensory exam, and no pain to palpation at the distal phalanx itself but has onycholysis of the nail plate with no active infection.  Radiographs: Multiple views x-ray of the right foot: Radiographs of right foot taken on 09/20/2023 showed nondisplaced distal tuft fracture of right hallux distal phalanx Assessment:   1. Onycholysis   2. Closed nondisplaced fracture of distal phalanx of right great toe, initial encounter      Plan:  Patient was evaluated and treated and all questions answered.  Fracture is healing well at this point and should not need any further treatment.  Continue regular shoe gear as tolerated.  Secondary to her injury she did have a separate issue of onycholysis of the nail plate.  I recommended avulsion of the nail plate and allow the nail to regrow.  We discussed there is a possibility may not regrow.  Following sterile prep with Betadine the right hallux was anesthetized with lidocaine and Marcaine.  A tourniquet was secured around the base of the toe and the nail plate was removed.  Sterile bandage was applied.  Post care instructions given.  Follow-up as needed if there are issues with the toenail.  Return if symptoms worsen or fail to improve.

## 2023-10-31 ENCOUNTER — Encounter: Payer: Self-pay | Admitting: Nurse Practitioner

## 2023-10-31 ENCOUNTER — Other Ambulatory Visit: Payer: Self-pay | Admitting: Nurse Practitioner

## 2023-10-31 ENCOUNTER — Ambulatory Visit: Payer: Medicaid Other | Admitting: Nurse Practitioner

## 2023-10-31 ENCOUNTER — Ambulatory Visit
Admission: RE | Admit: 2023-10-31 | Discharge: 2023-10-31 | Disposition: A | Discharge status: Home / Self Care | Payer: Medicaid Other | Source: Ambulatory Visit | Attending: Nurse Practitioner | Admitting: Nurse Practitioner

## 2023-10-31 ENCOUNTER — Ambulatory Visit
Admission: RE | Admit: 2023-10-31 | Discharge: 2023-10-31 | Disposition: A | Discharge status: Home / Self Care | Payer: Medicaid Other | Attending: Nurse Practitioner | Admitting: Nurse Practitioner

## 2023-10-31 ENCOUNTER — Ambulatory Visit: Payer: Self-pay | Admitting: *Deleted

## 2023-10-31 VITALS — BP 122/80 | HR 126 | Temp 98.4°F | Resp 20 | Ht 66.0 in | Wt 319.7 lb

## 2023-10-31 DIAGNOSIS — R079 Chest pain, unspecified: Secondary | ICD-10-CM | POA: Insufficient documentation

## 2023-10-31 DIAGNOSIS — R051 Acute cough: Secondary | ICD-10-CM

## 2023-10-31 DIAGNOSIS — R0602 Shortness of breath: Secondary | ICD-10-CM

## 2023-10-31 DIAGNOSIS — J189 Pneumonia, unspecified organism: Secondary | ICD-10-CM

## 2023-10-31 DIAGNOSIS — R52 Pain, unspecified: Secondary | ICD-10-CM

## 2023-10-31 LAB — POC COVID19/FLU A&B COMBO
Covid Antigen, POC: NEGATIVE
Influenza A Antigen, POC: NEGATIVE
Influenza B Antigen, POC: NEGATIVE

## 2023-10-31 MED ORDER — BENZONATATE 100 MG PO CAPS: 200.0000 mg | ORAL_CAPSULE | Freq: Three times a day (TID) | ORAL | 0 refills | Status: DC | PRN

## 2023-10-31 MED ORDER — AMOXICILLIN-POT CLAVULANATE 875-125 MG PO TABS: 1.0000 | ORAL_TABLET | Freq: Two times a day (BID) | ORAL | 0 refills | Status: AC

## 2023-10-31 MED ORDER — AZITHROMYCIN 250 MG PO TABS: ORAL_TABLET | ORAL | 0 refills | Status: AC

## 2023-10-31 MED ORDER — PROMETHAZINE-DM 6.25-15 MG/5ML PO SYRP: 5.0000 mL | ORAL_SOLUTION | Freq: Four times a day (QID) | ORAL | 0 refills | Status: DC | PRN

## 2023-10-31 NOTE — Progress Notes (Signed)
BP 122/80   Pulse (!) 126   Temp 98.4 F (36.9 C)   Resp 20   Ht 5\' 6"  (1.676 m)   Wt (!) 319 lb 11.2 oz (145 kg)   SpO2 95%   BMI 51.60 kg/m    Subjective:    Patient ID: Yesenia Thomas, female    DOB: Jan 17, 1981, 43 y.o.   MRN: [id number]  HPI: Yesenia Thomas is a 43 y.o. female  Chief Complaint  Patient presents with   URI    Body aches, cough, congestion, SOB.  Has been exsposed to the flu, onset 2 weeks    Discussed the use of AI scribe software for clinical note transcription with the patient, who gave verbal consent to proceed.  History of Present Illness   The patient, presents with persistent flu-like symptoms and chest pain. She reports that her son was diagnosed with the flu two weeks ago, and she began experiencing symptoms the following day. Despite treatment, her condition has not improved and has recently worsened. She describes symptoms of fever, sweating, chills, and shortness of breath. She also reports a significant increase in chest pain and dizziness, which she describes as feeling like she is about to faint.  The patient's chest pain has been present for several days but has recently intensified. She describes it as a pulling sensation in her chest, which is exacerbated by certain movements. She also reports a persistent cough and difficulty breathing, which has not improved despite using a nebulizer with albuterol.  In addition to these symptoms, the patient also reports chronic dizziness and fainting spells, particularly when standing still or sitting. She has sought medical attention for these symptoms in the past but has not received a diagnosis. She expresses frustration with the lack of diagnosis and mentions that her mother suggested she might need an MRI for a potential back issue.  The patient also reports being diagnosed with vitamin deficiencies a few weeks ago and was prescribed vitamin D and iron supplements. However, she reports that  the iron supplements caused significant stomach and chest pain, leading her to discontinue their use. She expresses concern about her diet and mentions feeling constantly tired and unable to get good sleep due to her pain.       10/10/2023    9:38 AM 09/20/2023    1:55 PM 07/23/2022   10:30 AM  Depression screen PHQ 2/9  Decreased Interest 0 0 0  Down, Depressed, Hopeless 0 0 0  PHQ - 2 Score 0 0 0  Altered sleeping   0  Tired, decreased energy   0  Change in appetite   0  Feeling bad or failure about yourself    0  Trouble concentrating   0  Moving slowly or fidgety/restless   0  Suicidal thoughts   0  PHQ-9 Score   0  Difficult doing work/chores   Not difficult at all    Relevant past medical, surgical, family and social history reviewed and updated as indicated. Interim medical history since our last visit reviewed. Allergies and medications reviewed and updated.  Review of Systems  Ten systems reviewed and is negative except as mentioned in HPI      Objective:    BP 122/80   Pulse (!) 126   Temp 98.4 F (36.9 C)   Resp 20   Ht 5\' 6"  (1.676 m)   Wt (!) 319 lb 11.2 oz (145 kg)   SpO2 95%   BMI  51.60 kg/m    Wt Readings from Last 3 Encounters:  10/31/23 (!) 319 lb 11.2 oz (145 kg)  10/10/23 (!) 331 lb 4.8 oz (150.3 kg)  09/20/23 (!) 335 lb 1.6 oz (152 kg)    Physical Exam Vitals reviewed.  Constitutional:      Appearance: Normal appearance.  HENT:     Head: Normocephalic.     Right Ear: Tympanic membrane normal.     Left Ear: Tympanic membrane normal.     Nose: Nose normal.     Right Sinus: No maxillary sinus tenderness or frontal sinus tenderness.     Left Sinus: No maxillary sinus tenderness or frontal sinus tenderness.     Mouth/Throat:     Mouth: Mucous membranes are moist.     Pharynx: Oropharynx is clear. No oropharyngeal exudate or posterior oropharyngeal erythema.  Eyes:     Extraocular Movements: Extraocular movements intact.      Conjunctiva/sclera: Conjunctivae normal.     Pupils: Pupils are equal, round, and reactive to light.  Cardiovascular:     Rate and Rhythm: Regular rhythm. Tachycardia present.  Pulmonary:     Effort: Pulmonary effort is normal.     Breath sounds: Normal breath sounds.  Lymphadenopathy:     Cervical: No cervical adenopathy.  Skin:    General: Skin is warm and dry.  Neurological:     General: No focal deficit present.     Mental Status: She is alert and oriented to person, place, and time. Mental status is at baseline.  Psychiatric:        Mood and Affect: Mood normal.        Behavior: Behavior normal.        Thought Content: Thought content normal.        Judgment: Judgment normal.     Results for orders placed or performed in visit on 10/31/23  POC Covid19/Flu A&B Antigen   Collection Time: 10/31/23  9:22 AM  Result Value Ref Range   Influenza A Antigen, POC Negative Negative   Influenza B Antigen, POC Negative Negative   Covid Antigen, POC Negative Negative       Assessment & Plan:   Problem List Items Addressed This Visit   None Visit Diagnoses       Acute cough    -  Primary   Relevant Medications   benzonatate (TESSALON PERLES) 100 MG capsule   promethazine-dextromethorphan (PROMETHAZINE-DM) 6.25-15 MG/5ML syrup   azithromycin (ZITHROMAX) 250 MG tablet   Other Relevant Orders   POC Covid19/Flu A&B Antigen (Completed)     Body aches       Relevant Medications   benzonatate (TESSALON PERLES) 100 MG capsule   promethazine-dextromethorphan (PROMETHAZINE-DM) 6.25-15 MG/5ML syrup   azithromycin (ZITHROMAX) 250 MG tablet   Other Relevant Orders   POC Covid19/Flu A&B Antigen (Completed)     Shortness of breath       Relevant Medications   benzonatate (TESSALON PERLES) 100 MG capsule   promethazine-dextromethorphan (PROMETHAZINE-DM) 6.25-15 MG/5ML syrup   azithromycin (ZITHROMAX) 250 MG tablet   Other Relevant Orders   EKG 12-Lead   DG Chest 2 View     Chest  pain, unspecified type       Relevant Medications   benzonatate (TESSALON PERLES) 100 MG capsule   promethazine-dextromethorphan (PROMETHAZINE-DM) 6.25-15 MG/5ML syrup   azithromycin (ZITHROMAX) 250 MG tablet   Other Relevant Orders   EKG 12-Lead   DG Chest 2 View        Assessment  and Plan    Respiratory Illness Persistent cough, chest pain, and shortness of breath for two weeks following exposure to son with diagnosed flu. Negative flu test today. Concern for possible pneumonia given worsening symptoms. -Order chest x-ray to rule out pneumonia. -Prescribe Zithromax as a precautionary measure. -Prescribe two different cough phenergan-DM and tessalon perls -Advise patient to purchase over-the-counter Mucinex to help thin mucus. -Advise patient to push fluids and avoid dairy to help thin mucus.  Vitamin Deficiencies Previously diagnosed with multiple vitamin deficiencies. Currently taking vitamin D and iron supplements, but iron supplements causing stomach and chest discomfort. -Advise patient to continue taking vitamin D. -Advise patient to consider taking a multivitamin with iron and vitamin D. -Advise patient to consider taking stool softeners to help with discomfort caused by iron supplements.  Follow-up -Notify patient of chest x-ray results later in the day. -If chest x-ray is negative for pneumonia, consider prescribing a steroid taper to help with cough.        Follow up plan: Return if symptoms worsen or fail to improve.

## 2023-10-31 NOTE — Telephone Encounter (Signed)
Reason for Disposition  [1] Patient is NOT HIGH RISK AND [2] strongly requests antiviral medicine AND [3] flu symptoms present < 48 hours  Answer Assessment - Initial Assessment Questions 1. WORST SYMPTOM: "What is your worst symptom?" (e.g., cough, runny nose, muscle aches, headache, sore throat, fever)      Body ache, chills, fever, My son had the flu a couple of weeks ago.   I think I have the flu.   I feel like I'm getting worse 2. ONSET: "When did your flu symptoms start?"      Congestion chest pain, coughing, dizzy like fainting, trouble breathing.   My chest pain hurts when I cough and also with slight movement.   I'm getting pulling like pain.    I'm coughing so much.    I'm coughing up a lot of stuff. Started 2 weeks ago and getting worse.   I got better for a little while but then I started getting worse after taking Mullin Tea.   It seemed to make me worse.    It detoxes everything from my body since childhood.   I can't stop coughing up stuff. 3. COUGH: "How bad is the cough?"       It's yellow/brown mucus. 4. RESPIRATORY DISTRESS: "Describe your breathing."      Short of breath  5. FEVER: "Do you have a fever?" If Yes, ask: "What is your temperature, how was it measured, and when did it start?"     I'm feeling hot and cold and having body aches 6. EXPOSURE: "Were you exposed to someone with influenza?"       Yes my son had the flu 2 weeks ago. 7. FLU VACCINE: "Did you get a flu shot this year?"     Not asked 8. HIGH RISK DISEASE: "Do you have any chronic medical problems?" (e.g., heart or lung disease, asthma, weak immune system, or other HIGH RISK conditions)     No   I think I may have pneumonia 9. PREGNANCY: "Is there any chance you are pregnant?" "When was your last menstrual period?"     Not asked 10. OTHER SYMPTOMS: "Do you have any other symptoms?"  (e.g., runny nose, muscle aches, headache, sore throat)       See above  Protocols used: Influenza (Flu) -  [facility]

## 2023-10-31 NOTE — Telephone Encounter (Signed)
  Chief Complaint: Short of breath, chest pain, coughing a lot of congestion in chest Symptoms: body aches, hot and cold,   Son had flu 2 weeks ago now she feels she has it and is going into pneumonia Frequency: 2 weeks and getting worse Pertinent Negatives: Patient denies getting better Disposition: [] ED /[] Urgent Care (no appt availability in office) / [x] Appointment(In office/virtual)/ []  [facility]/ [] Home Care/ [] Refused Recommended Disposition /[] [place] [facility]/ []  Follow-up with PCP Additional Notes: Appt made for today with Della Goo FNP for 9:20

## 2023-11-07 ENCOUNTER — Other Ambulatory Visit: Payer: Self-pay | Admitting: Internal Medicine

## 2023-11-07 DIAGNOSIS — M5416 Radiculopathy, lumbar region: Secondary | ICD-10-CM

## 2023-11-08 NOTE — Telephone Encounter (Signed)
 Requested medications are due for refill today.  yes  Requested medications are on the active medications list.  yes  Last refill. 10/10/2023 #30 0 rf  Future visit scheduled.   no  Notes to clinic.  Refill not delegated.     Requested Prescriptions  Pending Prescriptions Disp Refills   tiZANidine (ZANAFLEX) 4 MG tablet [Pharmacy Med Name: TIZANIDINE 4MG  TABLETS] 30 tablet 0    Sig: TAKE 1 TABLET(4 MG) BY MOUTH AT BEDTIME AS NEEDED FOR MUSCLE SPASMS     Not Delegated - Cardiovascular:  Alpha-2 [facility]onists - tizanidine Failed - 11/08/2023 12:27 PM      Failed - This refill cannot be delegated      Passed - Valid encounter within last 6 months    Recent Outpatient Visits           4 weeks ago Annual physical exam   [facility] Margarita Mail, DO   1 month ago Pain of right great toe   [facility] Margarita Mail, DO   1 year ago Upper respiratory tract infection, unspecified type   [facility] Danelle Berry, PA-C   1 year ago Iron deficiency anemia, unspecified iron deficiency anemia type   [facility] Margarita Mail, DO   1 year ago Community acquired pneumonia of left lower lobe of lung   [facility] Margarita Mail, [place]

## 2023-11-17 ENCOUNTER — Ambulatory Visit: Payer: Self-pay | Admitting: Internal Medicine

## 2023-11-17 NOTE — Telephone Encounter (Signed)
  Chief Complaint: back, hip, and leg pain Symptoms: pain, weakness in legs Frequency: comes and goes   Disposition: [] ED /[] Urgent Care (no appt availability in office) / [x] Appointment(In office/virtual)/ []  [facility]/ [] Home Care/ [] Refused Recommended Disposition /[] [place] [facility]/ []  Follow-up with PCP Additional Notes: Pt calling with back, bilateral hip/leg pain. Pt has been dealing with this for 7 years. Pt was cleaning out car and tweak back. Pt had to call out of work due to pain. Pt states pain intensifies to 10 depending on what activities she's doing. Pt has taken Excedrin for pain but not enough. Pt is requesting doctors note to back calling out of work for  couple days. Pt has app tomorrow at 1000. RN gave care advice and pt verbalized understanding.                 Copied from CRM [id number]. Topic: Clinical - Red Word Triage >> Nov 17, 2023 11:54 AM Higinio Roger wrote: Red Word that prompted transfer to Nurse Triage: Severe pain in lower back and hips. Pain level 10 Reason for Disposition  Numbness in a leg or foot (i.e., loss of sensation)  Answer Assessment - Initial Assessment Questions 1. ONSET: "When did the pain begin?"      7 years ago 2017 2. LOCATION: "Where does it hurt?" (upper, mid or lower back)     Back, both hips and legs  3. SEVERITY: "How bad is the pain?"  (e.g., Scale 1-10; mild, moderate, or severe)   - MILD (1-3): Doesn't interfere with normal activities.    - MODERATE (4-7): Interferes with normal activities or awakens from sleep.    - SEVERE (8-10): Excruciating pain, unable to do any normal activities.      10 4. PATTERN: "Is the pain constant?" (e.g., yes, no; constant, intermittent)      Comes and goes  5. RADIATION: "Does the pain shoot into your legs or somewhere else?"     yes 6. CAUSE:  "What do you think is causing the back pain?"      Cleaning car 7. BACK OVERUSE:  "Any recent lifting of heavy objects,  strenuous work or exercise?"     yes 8. MEDICINES: "What have you taken so far for the pain?" (e.g., nothing, acetaminophen, NSAIDS)     Excedrin and tylenol  9. NEUROLOGIC SYMPTOMS: "Do you have any weakness, numbness, or problems with bowel/bladder control?"     Weakness in hands- "normal for her" 10. OTHER SYMPTOMS: "Do you have any other symptoms?" (e.g., fever, abdomen pain, burning with urination, blood in urine)       Denies  Protocols used: Back Pain-A-AH

## 2023-11-18 ENCOUNTER — Encounter: Payer: Self-pay | Admitting: Family Medicine

## 2023-11-18 ENCOUNTER — Ambulatory Visit
Admission: RE | Admit: 2023-11-18 | Discharge: 2023-11-18 | Disposition: A | Discharge status: Home / Self Care | Source: Ambulatory Visit | Attending: Family Medicine | Admitting: Family Medicine

## 2023-11-18 ENCOUNTER — Ambulatory Visit
Admission: RE | Admit: 2023-11-18 | Discharge: 2023-11-18 | Disposition: A | Discharge status: Home / Self Care | Attending: Family Medicine | Admitting: Family Medicine

## 2023-11-18 ENCOUNTER — Ambulatory Visit: Admitting: Family Medicine

## 2023-11-18 VITALS — BP 130/76 | HR 90 | Resp 16 | Ht 66.0 in | Wt 334.0 lb

## 2023-11-18 DIAGNOSIS — M5441 Lumbago with sciatica, right side: Secondary | ICD-10-CM | POA: Diagnosis present

## 2023-11-18 DIAGNOSIS — G8929 Other chronic pain: Secondary | ICD-10-CM

## 2023-11-18 MED ORDER — PREDNISONE 20 MG PO TABS: 40.0000 mg | ORAL_TABLET | Freq: Every day | ORAL | 0 refills | Status: AC

## 2023-11-18 MED ORDER — TIZANIDINE HCL 4 MG PO TABS: 2.0000 mg | ORAL_TABLET | Freq: Three times a day (TID) | ORAL | 1 refills | Status: DC | PRN

## 2023-11-18 NOTE — Progress Notes (Signed)
 Patient ID: Yesenia Thomas, female    DOB: 26-Oct-1980, 43 y.o.   MRN: [id number]  PCP: Margarita Mail, DO  Chief Complaint  Patient presents with   Back Pain    "7 yrs"- back and thighs    Subjective:   Yesenia Thomas is a 43 y.o. 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 and she tweeked it pulling her Zenaida Niece seats out of the floor and that hurt her back Currently radiation to both legs R worse than L     Patient Active Problem List   Diagnosis Date Noted   Dizzy spells 08/26/2021   Lumbar back pain with radiculopathy affecting right lower extremity 08/26/2021   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   Dysthymia 08/26/2021   Severe obesity (BMI >= 40) (HCC) 07/20/2017   Iron deficiency anemia 08/02/2013   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   GERD (gastroesophageal reflux disease) 04/17/2013      Current Outpatient Medications:    Acetaminophen 500 MG coapsule, Take by mouth as needed., Disp: , Rfl:    albuterol (VENTOLIN HFA) 108 (90 Base) MCG/ACT inhaler, Inhale 2 puffs into the lungs every 6 (six) hours as needed for wheezing or shortness of breath., Disp: 8 g, Rfl: 2   cyanocobalamin (VITAMIN B12) 1000 MCG tablet, Take 3,000 mcg by mouth daily., Disp: , Rfl:    famotidine (PEPCID) 20 MG tablet, Take 1 tablet (20 mg total) by mouth daily., Disp: 90 tablet, Rfl: 1   ferrous gluconate (FERGON) 324 MG tablet, Take 1 tablet (324 mg total) by mouth daily., Disp: 90 tablet, Rfl: 3   fluticasone (FLONASE) 50 MCG/ACT nasal spray, Place 2 sprays into both nostrils daily., Disp: 16 g, Rfl: 6   tiZANidine (ZANAFLEX) 4 MG tablet, Take 1  tablet (4 mg total) by mouth at bedtime as needed for muscle spasms., Disp: 30 tablet, Rfl: 0   Turmeric (QC TUMERIC COMPLEX) 500 MG CAPS, Take by mouth., Disp: , Rfl:    Vitamin D, Ergocalciferol, (DRISDOL) 1.25 MG (50000 UNIT) CAPS capsule, Take 1 capsule (50,000 Units total) by mouth every 7 (seven) days., Disp: 12 capsule, Rfl: 0   benzonatate (TESSALON PERLES) 100 MG capsule, Take 2 capsules (200 mg total) by mouth 3 (three) times daily as needed for cough. (Patient not taking: Reported on 11/18/2023), Disp: 20 capsule, Rfl: 0   promethazine-dextromethorphan (PROMETHAZINE-DM) 6.25-15 MG/5ML syrup, Take 5 mLs by mouth 4 (four) times daily as needed for cough. (Patient not taking: Reported on 11/18/2023), Disp: 118 mL, Rfl: 0   Allergies  Allergen Reactions   Lactose Intolerance (Gi)    Percocet [Oxycodone-Acetaminophen]      Social History   Tobacco Use   Smoking status: Never   Smokeless tobacco: Never  Vaping Use   Vaping status: Never Used  Substance Use Topics   Alcohol use: Not Currently   Drug use: Not Currently    Types: Marijuana      Chart Review Today: I personally reviewed active problem list, medication list, allergies, family history, social history, health maintenance, notes from last encounter, lab results, imaging with the patient/caregiver  today.   Review of Systems  Constitutional: Negative.   HENT: Negative.    Eyes: Negative.   Respiratory: Negative.    Cardiovascular: Negative.   Gastrointestinal: Negative.   Endocrine: Negative.   Genitourinary: Negative.   Musculoskeletal: Negative.   Skin: Negative.   Allergic/Immunologic: Negative.   Neurological: Negative.   Hematological: Negative.   Psychiatric/Behavioral: Negative.    All other systems reviewed and are negative.      Objective:   Vitals:   11/18/23 1014  BP: 130/76  Pulse: 90  Resp: 16  SpO2: 98%  Weight: (!) 334 lb (151.5 kg)  Height: 5\' 6"  (1.676 m)    Body mass index is  53.91 kg/m.  Physical Exam Vitals and nursing note reviewed.  Constitutional:      Appearance: She is well-developed. She is morbidly obese. She is not ill-appearing, toxic-appearing or diaphoretic.  HENT:     Head: Normocephalic and atraumatic.  Eyes:     General:        Right eye: No discharge.        Left eye: No discharge.  Neck:     Trachea: No tracheal deviation.  Cardiovascular:     Rate and Rhythm: Normal rate.  Pulmonary:     Effort: Pulmonary effort is normal. No respiratory distress.     Breath sounds: No stridor.  Musculoskeletal:     Comments: Exam limited by body habitus No midline tenderness or step off from cervical to lumbar spine, no paraspinal muscle ttp + SLR right, -SLR left Grossly normal sensation to light touch bilateral LE  Skin:    General: Skin is warm and dry.     Findings: No rash.  Neurological:     Mental Status: She is alert.     Motor: No abnormal muscle tone.  Psychiatric:        Behavior: Behavior normal.      Results for orders placed or performed in visit on 10/31/23  POC Covid19/Flu A&B Antigen   Collection Time: 10/31/23  9:22 AM  Result Value Ref Range   Influenza A Antigen, POC Negative Negative   Influenza B Antigen, POC Negative Negative   Covid Antigen, POC Negative Negative       Assessment & Plan:   1. Chronic bilateral low back pain with right-sided sciatica (Primary) Flare to low back pain after increased physical activity - long work days then cleaning at home and she thinks she really flared it up when she had to pull her Zenaida Niece seats up out of their stored position.  Pain is across low back radiation to buttock, hips, groin and down legs, severe enough to have to call out wed and Thursday this week, no improvement with heat, tylenol and excedrine. It feels better with walking and keeping heat on it No saddle anesthesia, numbness, weakness, incontinence Onset of sx more than 7 years ago with multiple flares, xrays in  chart of hips but I see no lumbar films, she has been referred to PT and ortho several times in the past but is not currently established with ortho or spine right now  With + SLR will tx with steroids, refills her muscle relaxers, encouraged her to try to walk but avoid any bending/lifting until feeling better, she can do prior home exercises from PT with similar back pain in the past Lumbar xray today since I cannot see prior imaging She wants MRI - I explained that usually MRIs are deferred until PT and medical managedment  has been done and failed, or specialists may want sooner.  - predniSONE (DELTASONE) 20 MG tablet; Take 2 tablets (40 mg total) by mouth daily with breakfast for 5 days.  Dispense: 10 tablet; Refill: 0 - tiZANidine (ZANAFLEX) 4 MG tablet; Take 0.5-1.5 tablets (2-6 mg total) by mouth every 8 (eight) hours as needed for muscle spasms (muscle tightness).  Dispense: 60 tablet; Refill: 1 - DG Lumbar Spine Complete - Ambulatory referral to Orthopedic Surgery  Work note given to excuse her from this Wednesday through a shift on Sunday and RTW after that She will need to f/up with [place] if unable to return to work      Danelle Berry, PA-C 11/18/23 10:26 AM

## 2023-12-31 ENCOUNTER — Other Ambulatory Visit: Payer: Self-pay | Admitting: Internal Medicine

## 2023-12-31 DIAGNOSIS — E559 Vitamin D deficiency, unspecified: Secondary | ICD-10-CM

## 2024-01-02 NOTE — Telephone Encounter (Signed)
 Requested medications are due for refill today.  yes  Requested medications are on the active medications list.  yes  Last refill. 10/11/2023 #12 0 rf  Future visit scheduled.   no  Notes to clinic.  Provider to review at this dosage.    Requested Prescriptions  Pending Prescriptions Disp Refills   Vitamin D , Ergocalciferol , (DRISDOL ) 1.25 MG (50000 UNIT) CAPS capsule [Pharmacy Med Name: VITAMIN D2 50,000IU (ERGO) CAP RX] 12 capsule 0    Sig: TAKE 1 CAPSULE BY MOUTH EVERY 7 DAYS     Endocrinology:  Vitamins - Vitamin D  Supplementation 2 Failed - 01/02/2024 11:48 AM      Failed - Manual Review: Route requests for 50,000 IU strength to the provider      Failed - Vitamin D  in normal range and within 360 days    Vit D, 25-Hydroxy  Date Value Ref Range Status  10/10/2023 7 (L) 30 - 100 ng/mL Final    Comment:    Vitamin D  Status         25-OH Vitamin D : . Deficiency:                    <20 ng/mL Insufficiency:             20 - 29 ng/mL Optimal:                 > or = 30 ng/mL . For 25-OH Vitamin D  testing on patients on  D2-supplementation and patients for whom quantitation  of D2 and D3 fractions is required, the [facility](TM) 25-OH VIT D, (D2,D3), LC/MS/MS is recommended: order  code 25366 (patients >52yrs). . See Note 1 . Note 1 . For additional information, please refer to  http://education.[facility].com/faq/FAQ199  (This link is being provided for informational/ educational purposes only.)          Passed - Ca in normal range and within 360 days    Calcium  Date Value Ref Range Status  10/10/2023 9.0 8.6 - 10.2 mg/dL Final         Passed - Valid encounter within last 12 months    Recent Outpatient Visits           1 month ago Chronic bilateral low back pain with right-sided sciatica   [facility] Adeline Hone, PA-C   2 months ago Acute cough   [facility] Quinton Buckler, [place]

## 2024-02-21 ENCOUNTER — Other Ambulatory Visit

## 2024-02-21 ENCOUNTER — Ambulatory Visit

## 2024-02-28 ENCOUNTER — Ambulatory Visit (LOCAL_COMMUNITY_HEALTH_CENTER): Payer: Self-pay

## 2024-02-28 DIAGNOSIS — R7611 Nonspecific reaction to tuberculin skin test without active tuberculosis: Secondary | ICD-10-CM

## 2024-02-28 NOTE — Progress Notes (Signed)
 In nurse clinic for TB screening. Hx of +PPD in 1998. TB Screening completed. Original given to patient and copy sent for scanning. All questions answered and verbalizes understanding.  Clare Critchley, RN

## 2024-04-11 ENCOUNTER — Ambulatory Visit: Payer: Self-pay

## 2024-04-11 NOTE — Telephone Encounter (Signed)
 FYI Only or Action Required?: FYI only for provider.  Patient was last seen in primary care on 10/31/2023 by Gareth Mliss FALCON, FNP.  Called Nurse Triage reporting Dizziness.  Symptoms began several years ago.  Interventions attempted: Nothing.  Symptoms are: gradually worsening.  Triage Disposition: Go to ED Now (Notify PCP)  Patient/caregiver understands and will follow disposition?: Yes  To the ER  Copied from CRM [id number]. Topic: Clinical - Red Word Triage >> Apr 11, 2024 11:44 AM Myrick T wrote: Red Word that prompted transfer to Nurse Triage: patient said when she stand up for any time over 15 minutes to cook or work she feels faint, dizzy and sweating. She has not fallen. Reason for Disposition  [1] Numbness (i.e., loss of sensation) of the face, arm / hand, or leg / foot on one side of the body AND [2] sudden onset AND [3] brief (now gone)  Answer Assessment - Initial Assessment Questions 1. DESCRIPTION: Describe your dizziness.     Since 2017, states dizzy, only when she stands for long periods of times 2. LIGHTHEADED: Do you feel lightheaded? (e.g., somewhat faint, woozy, weak upon standing)     After standing for a long time 3. VERTIGO: Do you feel like either you or the room is spinning or tilting? (i.e., vertigo)     denies 4. SEVERITY: How bad is it?  Do you feel like you are going to faint? Can you stand and walk?     faint 5. ONSET:  When did the dizziness begin?     Since 2017 6. AGGRAVATING FACTORS: Does anything make it worse? (e.g., standing, change in head position)     Standing for long periods of time 7. HEART RATE: Can you tell me your heart rate? How many beats in 15 seconds?  (Note: Not all patients can do this.)       States faster 8. CAUSE: What do you think is causing the dizziness? (e.g., decreased fluids or food, diarrhea, emotional distress, heat exposure, new medicine, sudden standing, vomiting; unknown)     unknown 9.  RECURRENT SYMPTOM: Have you had dizziness before? If Yes, ask: When was the last time? What happened that time?     yes 10. OTHER SYMPTOMS: Do you have any other symptoms? (e.g., fever, chest pain, vomiting, diarrhea, bleeding)       denies 11. PREGNANCY: Is there any chance you are pregnant? When was your last menstrual period?       na  Protocols used: Dizziness - Lightheadedness-A-AH

## 2024-05-02 ENCOUNTER — Encounter

## 2024-05-13 ENCOUNTER — Emergency Department
Admission: EM | Admit: 2024-05-13 | Discharge: 2024-05-13 | Disposition: A | Discharge status: Home / Self Care | Attending: Emergency Medicine | Admitting: Emergency Medicine

## 2024-05-13 ENCOUNTER — Other Ambulatory Visit: Payer: Self-pay

## 2024-05-13 DIAGNOSIS — R6883 Chills (without fever): Secondary | ICD-10-CM | POA: Diagnosis present

## 2024-05-13 DIAGNOSIS — J069 Acute upper respiratory infection, unspecified: Secondary | ICD-10-CM | POA: Diagnosis not present

## 2024-05-13 DIAGNOSIS — Z20822 Contact with and (suspected) exposure to covid-19: Secondary | ICD-10-CM | POA: Diagnosis not present

## 2024-05-13 LAB — RESP PANEL BY RT-PCR (RSV, FLU A&B, COVID)  RVPGX2
Influenza A by PCR: NEGATIVE
Influenza B by PCR: NEGATIVE
Resp Syncytial Virus by PCR: NEGATIVE
SARS Coronavirus 2 by RT PCR: NEGATIVE

## 2024-05-13 NOTE — ED Triage Notes (Signed)
 Patient ambulatory to triage with complaints of sore throat and chills. Son was just diagnosed with COVID today.

## 2024-05-13 NOTE — Discharge Instructions (Addendum)
 Please take Tylenol  650 mg 4 times daily and/or ibuprofen 600 mg 3 times daily with food.  This will help with pain and fever. Ensure you get plenty of fluids. Use Mucinex for any sinus congestion.  Follow-up with primary care for further evaluation in the next few days.  Return here for new or worse symptoms including difficulty breathing, severe pain, intractable vomiting

## 2024-05-13 NOTE — ED Provider Notes (Signed)
 [facility] Provider Note   CSN: [id number] Arrival date & time: 05/13/24  2047     Patient presents with: Sore Throat and Chills   Yesenia Thomas is a 43 y.o. female.   Patient here for flulike symptoms sore throat body aches chills.  Symptoms started within the past hour or 2 since being here when her son was diagnosed with COVID-19 virus.  No difficulty breathing or swallowing no known fevers.   Sore Throat Pertinent negatives include no chest pain and no abdominal pain.       Prior to Admission medications   Medication Sig Start Date End Date Taking? Authorizing Provider  Acetaminophen 500 MG coapsule Take by mouth as needed.    [provider]  albuterol  (VENTOLIN  HFA) 108 (90 Base) MCG/ACT inhaler Inhale 2 puffs into the lungs every 6 (six) hours as needed for wheezing or shortness of breath. 07/29/23   Cyrena Mylar, MD  benzonatate  (TESSALON  PERLES) 100 MG capsule Take 2 capsules (200 mg total) by mouth 3 (three) times daily as needed for cough. Patient not taking: Reported on 11/18/2023 10/31/23   Gareth Mliss FALCON, FNP  cyanocobalamin (VITAMIN B12) 1000 MCG tablet Take 3,000 mcg by mouth daily.    [provider]  famotidine  (PEPCID ) 20 MG tablet Take 1 tablet (20 mg total) by mouth daily. 05/03/22   Bernardo Fend, DO  ferrous gluconate  (FERGON) 324 MG tablet Take 1 tablet (324 mg total) by mouth daily. 10/11/23   Bernardo Fend, DO  fluticasone  (FLONASE ) 50 MCG/ACT nasal spray Place 2 sprays into both nostrils daily. 07/23/22   Tapia, Leisa, PA-C  promethazine -dextromethorphan  (PROMETHAZINE -DM) 6.25-15 MG/5ML syrup Take 5 mLs by mouth 4 (four) times daily as needed for cough. Patient not taking: Reported on 11/18/2023 10/31/23   Gareth Mliss FALCON, FNP  tiZANidine  (ZANAFLEX ) 4 MG tablet Take 1 tablet (4 mg total) by mouth at bedtime as needed for muscle spasms. 10/10/23   Bernardo Fend, DO  tiZANidine   (ZANAFLEX ) 4 MG tablet Take 0.5-1.5 tablets (2-6 mg total) by mouth every 8 (eight) hours as needed for muscle spasms (muscle tightness). 11/18/23   Tapia, Leisa, PA-C  Turmeric (QC TUMERIC COMPLEX) 500 MG CAPS Take by mouth.    [provider]  Vitamin D , Ergocalciferol , (DRISDOL ) 1.25 MG (50000 UNIT) CAPS capsule Take 1 capsule (50,000 Units total) by mouth every 7 (seven) days. 10/11/23   Bernardo Fend, DO    Allergies: Lactose intolerance (gi) and Percocet [oxycodone-acetaminophen]    Review of Systems  HENT:  Positive for congestion and sore throat.   Cardiovascular:  Negative for chest pain and leg swelling.  Gastrointestinal:  Negative for abdominal pain, diarrhea, nausea and vomiting.  All other systems reviewed and are negative.   Updated Vital Signs BP (!) 138/99   Pulse 73   Temp 97.8 F (36.6 C) (Oral)   Resp 20   Ht 5' 6 (1.676 m)   Wt (!) 143.3 kg   SpO2 100%   BMI 51.00 kg/m   Physical Exam Vitals reviewed.  Constitutional:      Appearance: Normal appearance.  HENT:     Head: Normocephalic and atraumatic.     Nose: Nose normal.     Mouth/Throat:     Mouth: No oral lesions.     Pharynx: No pharyngeal swelling or oropharyngeal exudate.  Cardiovascular:     Rate and Rhythm: Normal rate.     Pulses: Normal pulses.  Pulmonary:  Effort: Pulmonary effort is normal.  Musculoskeletal:     Cervical back: Normal range of motion.  Neurological:     Mental Status: She is alert and oriented to person, place, and time. Mental status is at baseline.  Psychiatric:        Mood and Affect: Mood normal.        Behavior: Behavior normal.     (all labs ordered are listed, but only abnormal results are displayed) Labs Reviewed  RESP PANEL BY RT-PCR (RSV, FLU A&B, COVID)  RVPGX2    EKG: None  Radiology: No results found.   Procedures   Medications Ordered in the ED - No data to display                                  Medical Decision  Making Patient here for flulike symptoms with close exposure to COVID-19 afebrile nontachycardic no hypoxia overall well-appearing OP is clear nonobstructive.  Suspect likely COVID or too early to test positive will likely be appropriate for outpatient treatment.        Final diagnoses:  Close exposure to COVID-19 virus  Upper respiratory tract infection, unspecified type    ED Discharge Orders     None          Kingston Mallick, DEVONNA 05/13/24 2148    Jacolyn Pae, MD 05/13/24 [phone]

## 2024-06-21 ENCOUNTER — Ambulatory Visit: Payer: Self-pay

## 2024-06-21 NOTE — Telephone Encounter (Signed)
 FYI Only or Action Required?: FYI only for provider.  Patient was last seen in primary care on 11/18/2023 by Tapia, Leisa, PA-C.  Called Nurse Triage reporting Breathing Problem.  Symptoms began several days ago.   Interventions attempted: OTC medications: cough suppressant and Other: Went to UC 3 days ago.  Symptoms are: gradually worsening.  Triage Disposition: See Physician Within 24 Hours  Patient/caregiver understands and will follow disposition?: Yes- Pt requesting earlier visit than what is avail at [facility]. Appt scheduled for 10/10 at 10a at [facility] Copied from [facility] #[id number]. Topic: Clinical - Red Word Triage >> Jun 21, 2024 10:56 AM Ivette P wrote: Red Word that prompted transfer to Nurse Triage: pt having trouble breathing, throat hurts. Vomiting Reason for Disposition  SEVERE coughing spells (e.g., whooping sound after coughing, vomiting after coughing)  Answer Assessment - Initial Assessment Questions 1. ONSET: When did the cough begin?      Started 4 days ago  2. SEVERITY: How bad is the cough today?      Getting worse  3. SPUTUM: Describe the color of your sputum (e.g., none, dry cough; clear, white, yellow, green)     Dark yellow  4. HEMOPTYSIS: Are you coughing up any blood? If Yes, ask: How much? (e.g., flecks, streaks, tablespoons, etc.)     Sometimes has streaks of blood  5. DIFFICULTY BREATHING: Are you having difficulty breathing? If Yes, ask: How bad is it? (e.g., mild, moderate, severe)      Mild shortness of breath when cougjing  6. FEVER: Do you have a fever? If Yes, ask: What is your temperature, how was it measured, and when did it start?     No  7. CARDIAC HISTORY: Do you have any history of heart disease? (e.g., heart attack, congestive heart failure)      No  8. LUNG HISTORY: Do you have any history of lung disease?  (e.g., pulmonary embolus, asthma, emphysema)     No  9. PE RISK FACTORS: Do you have a history of blood  clots? (or: recent major surgery, recent prolonged travel, bedridden)     No  10. OTHER SYMPTOMS: Do you have any other symptoms? (e.g., runny nose, wheezing, chest pain)       Sore throat, hoarse voice, chest congestion, vomiting since this morning  11. PREGNANCY: Is there any chance you are pregnant? When was your last menstrual period?       Unsure of last period  76. TRAVEL: Have you traveled out of the country in the last month? (e.g., travel history, exposures)       No  Went to Urgent Care on Monday, but didn't received abx. She also tested neg for Covid  Protocols used: Cough - Acute Productive-A-AH

## 2024-06-22 ENCOUNTER — Ambulatory Visit: Admitting: Family Medicine

## 2024-06-22 ENCOUNTER — Encounter: Payer: Self-pay | Admitting: Family Medicine

## 2024-06-22 ENCOUNTER — Ambulatory Visit: Admitting: Family

## 2024-06-22 VITALS — BP 124/88 | HR 78 | Temp 98.3°F | Ht 66.0 in | Wt 321.6 lb

## 2024-06-22 DIAGNOSIS — B9689 Other specified bacterial agents as the cause of diseases classified elsewhere: Secondary | ICD-10-CM | POA: Diagnosis not present

## 2024-06-22 DIAGNOSIS — J209 Acute bronchitis, unspecified: Secondary | ICD-10-CM | POA: Diagnosis not present

## 2024-06-22 DIAGNOSIS — J019 Acute sinusitis, unspecified: Secondary | ICD-10-CM | POA: Diagnosis not present

## 2024-06-22 DIAGNOSIS — R051 Acute cough: Secondary | ICD-10-CM

## 2024-06-22 MED ORDER — AZITHROMYCIN 250 MG PO TABS: ORAL_TABLET | ORAL | 0 refills | Status: AC

## 2024-06-22 MED ORDER — ALBUTEROL SULFATE HFA 108 (90 BASE) MCG/ACT IN AERS: 2.0000 | INHALATION_SPRAY | Freq: Four times a day (QID) | RESPIRATORY_TRACT | 0 refills | Status: AC | PRN

## 2024-06-22 MED ORDER — PROMETHAZINE-DM 6.25-15 MG/5ML PO SYRP: 5.0000 mL | ORAL_SOLUTION | Freq: Four times a day (QID) | ORAL | 0 refills | Status: AC | PRN

## 2024-06-22 MED ORDER — BENZONATATE 100 MG PO CAPS: 100.0000 mg | ORAL_CAPSULE | Freq: Two times a day (BID) | ORAL | 0 refills | Status: AC | PRN

## 2024-06-22 NOTE — Progress Notes (Signed)
 Established patient visit   Patient: Yesenia Thomas   DOB: 1981-03-16   43 y.o. Female  MRN: [id number] Visit Date: 06/22/2024  Today's healthcare provider: LAURAINE LOISE BUOY, DO   Chief Complaint  Patient presents with   Acute Visit    Bad cough with productive phelgm which is green, went to urgent care for flu, strep, and covid but all was negative.  Still has a bad headache and having problems with breathing.  Stated that she has vomited reports a fever on Monday and Tuesday.      Subjective    HPI Yesenia Thomas is a 43 year old female who presents with a productive cough and difficulty breathing.  She has been experiencing a productive cough with green phlegm since Sunday, which has progressively worsened, accompanied by difficulty breathing. She had COVID-19 about a month ago and now experiences a severe headache and trouble breathing. She visited urgent care on Monday, where tests were conducted, but all results were negative.  She experienced a fever on Monday and Tuesday and vomited early Thursday morning (3-4 AM). Her cough produces green-yellowish phlegm, and she lost her voice on Sunday. She reports chest tightness, especially when taking deep breaths, and occasional dizziness when coughing. She also experiences sinus pain and pressure, a sore throat, and a runny nose with postnasal drip. No ear pain and no fever since Tuesday.  She has a history of pneumonia in February, treated with antibiotics including azithromycin . She has not taken any antibiotics recently. She mentions taking DayQuil, which helps but causes her mucus to thicken and sometimes results in streaks of blood. She also took a nighttime cold pill, which helped her sleep. 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  Her son was also sick around the same time, with symptoms starting on Friday and subsiding by Monday after  an urgent care visit. He still has a slight cough but has mostly recovered. She has a lot of anxiety about taking medications that might make her too sleepy to care for her son.      Medications: Outpatient Medications Prior to Visit  Medication Sig   Acetaminophen 500 MG coapsule Take by mouth as needed.   famotidine  (PEPCID ) 20 MG tablet Take 1 tablet (20 mg total) by mouth daily. (Patient taking differently: Take 20 mg by mouth as needed.)   [DISCONTINUED] albuterol  (VENTOLIN  HFA) 108 (90 Base) MCG/ACT inhaler Inhale 2 puffs into the lungs every 6 (six) hours as needed for wheezing or shortness of breath.   [DISCONTINUED] benzonatate  (TESSALON  PERLES) 100 MG capsule Take 2 capsules (200 mg total) by mouth 3 (three) times daily as needed for cough. (Patient not taking: Reported on 11/18/2023)   [DISCONTINUED] cyanocobalamin (VITAMIN B12) 1000 MCG tablet Take 3,000 mcg by mouth daily.   [DISCONTINUED] ferrous gluconate  (FERGON) 324 MG tablet Take 1 tablet (324 mg total) by mouth daily.   [DISCONTINUED] fluticasone  (FLONASE ) 50 MCG/ACT nasal spray Place 2 sprays into both nostrils daily.   [DISCONTINUED] promethazine -dextromethorphan  (PROMETHAZINE -DM) 6.25-15 MG/5ML syrup Take 5 mLs by mouth 4 (four) times daily as needed for cough. (Patient not taking: Reported on 11/18/2023)   [DISCONTINUED] tiZANidine  (ZANAFLEX ) 4 MG tablet Take 1 tablet (4 mg total) by mouth at bedtime as needed for muscle spasms.   [DISCONTINUED] tiZANidine  (ZANAFLEX ) 4 MG tablet Take 0.5-1.5 tablets (2-6 mg total) by mouth every 8 (eight) hours as needed  for muscle spasms (muscle tightness).   [DISCONTINUED] Turmeric (QC TUMERIC COMPLEX) 500 MG CAPS Take by mouth.   [DISCONTINUED] Vitamin D , Ergocalciferol , (DRISDOL ) 1.25 MG (50000 UNIT) CAPS capsule Take 1 capsule (50,000 Units total) by mouth every 7 (seven) days.   No facility-administered medications prior to visit.    Review of Systems  Constitutional:  Positive for  fatigue and fever (resolved by [place]). Negative for chills.  HENT:  Positive for congestion, postnasal drip, rhinorrhea, sinus pressure, sinus pain, sore throat and voice change. Negative for ear pain and trouble swallowing.   Respiratory:  Positive for cough (productive), chest tightness and shortness of breath.   Cardiovascular:  Negative for chest pain and palpitations.  Neurological:  Positive for dizziness (when coughing).        Objective    BP 124/88 (BP Location: Right Arm, Patient Position: Sitting, Cuff Size: Large)   Pulse 78   Temp 98.3 F (36.8 C) (Oral)   Ht 5' 6 (1.676 m)   Wt (!) 321 lb 9.6 oz (145.9 kg)   SpO2 100%   BMI 51.91 kg/m     Physical Exam Vitals reviewed.  Constitutional:      General: She is not in acute distress.    Appearance: Normal appearance. She is well-developed. She is not diaphoretic.  HENT:     Head: Normocephalic and atraumatic.     Right Ear: Tympanic membrane, ear canal and external ear normal.     Left Ear: Tympanic membrane, ear canal and external ear normal.     Nose: Congestion and rhinorrhea present.     Mouth/Throat:     Mouth: Mucous membranes are moist.     Pharynx: Oropharynx is clear. No oropharyngeal exudate or posterior oropharyngeal erythema.  Eyes:     General: No scleral icterus.    Conjunctiva/sclera: Conjunctivae normal.     Pupils: Pupils are equal, round, and reactive to light.  Cardiovascular:     Rate and Rhythm: Normal rate and regular rhythm.     Pulses: Normal pulses.     Heart sounds: Normal heart sounds. No murmur heard. Pulmonary:     Effort: Pulmonary effort is normal. No respiratory distress.     Breath sounds: Normal breath sounds. No wheezing or rales.  Musculoskeletal:     Cervical back: Neck supple.     Right lower leg: No edema.     Left lower leg: No edema.  Lymphadenopathy:     Cervical: Cervical adenopathy present.     Right cervical: Superficial cervical adenopathy present.      Left cervical: Superficial cervical adenopathy present.  Skin:    General: Skin is warm and dry.     Findings: No rash.  Neurological:     Mental Status: She is alert.      No results found for any visits on 06/22/24.  Assessment & Plan    Acute bacterial sinusitis -     Azithromycin ; Take 2 tablets on day 1, then 1 tablet daily on days 2 through 5  Dispense: 6 tablet; Refill: 0  Acute bronchitis, unspecified organism  Acute cough -     Benzonatate ; Take 1 capsule (100 mg total) by mouth 2 (two) times daily as needed for cough.  Dispense: 30 capsule; Refill: 0 -     Promethazine -DM; Take 5 mLs by mouth 4 (four) times daily as needed for cough.  Dispense: 118 mL; Refill: 0 -     Albuterol  Sulfate HFA; Inhale 2 puffs into  the lungs every 6 (six) hours as needed for wheezing or shortness of breath.  Dispense: 8 g; Refill: 0     Acute bacterial sinusitis; acute bronchitis, unspecified organism; acute cough Acute sinusitis with sinus pain, pressure, rhinorrhea, and postnasal drip, with concomitant acute bronchitis with productive cough, chest tightness, and dyspnea. No recent antibiotic use. Previous pneumonia in February. Clear lung sounds. Chest x-ray if symptoms persist or worsen by midweek. - Prescribed azithromycin . - Prescribed benzonatate  for cough. - Prescribed promethazine  DM for cough. - Prescribed albuterol  inhaler.    Return if symptoms worsen or fail to improve.      I discussed the assessment and treatment plan with the patient  The patient was provided an opportunity to ask questions and all were answered. The patient agreed with the plan and demonstrated an understanding of the instructions.   The patient was advised to call back or seek an in-person evaluation if the symptoms worsen or if the condition fails to improve as anticipated.    LAURAINE LOISE BUOY, DO  [facility] [phone] (phone) [phone] (fax)  [facility]

## 2024-06-22 NOTE — Patient Instructions (Signed)
 Your symptoms today are most likely being caused by a bacterial infection and should steadily improve in time it can take up to 7 to 10 days before you truly start to see a turnaround.   You can take Tylenol and/or Ibuprofen  as needed for fever reduction and pain relief.   For cough: honey 1/2 to 1 teaspoon (you can dilute the honey in water or another fluid).  You can also use guaifenesin and dextromethorphan  for cough.   - You can use a humidifier for chest congestion and cough.  If you don't have a humidifier, you can sit in the bathroom with the hot shower running.      For sore throat: try warm salt water gargles, cepacol lozenges, throat spray, warm tea or water with lemon/honey, popsicles or ice, or OTC cold relief medicine for throat discomfort.   For congestion:   - take a daily anti-histamine like Zyrtec  (cetirizine ), Claritin (loratadine), or Allegra (fexofenadine),   - If you do NOT have high blood pressure, you can also take an oral decongestant, such as pseudoephedrine.    - You can also use Flonase  1-2 sprays in each nostril daily.   It is important to stay hydrated: drink plenty of fluids (water, gatorade/powerade/pedialyte, juices, or teas) to keep your throat moisturized and help further relieve irritation/discomfort.  These should be sugar-free if you are diabetic.
